# Patient Record
Sex: Male | Born: 1990 | Hispanic: Yes | Marital: Married | Smoking: Never smoker
Health system: Southern US, Community
[De-identification: ages and names within clinical notes are randomized; demographics above are authoritative.]

## PROBLEM LIST (undated history)

## (undated) DIAGNOSIS — K219 Gastro-esophageal reflux disease without esophagitis: Secondary | ICD-10-CM

## (undated) DIAGNOSIS — Z9289 Personal history of other medical treatment: Secondary | ICD-10-CM

---

## 2012-06-18 DIAGNOSIS — Z9289 Personal history of other medical treatment: Secondary | ICD-10-CM

## 2012-06-18 HISTORY — DX: Personal history of other medical treatment: Z92.89

## 2012-09-09 ENCOUNTER — Encounter: Payer: Self-pay | Admitting: Cardiovascular Disease

## 2012-09-09 ENCOUNTER — Ambulatory Visit (INDEPENDENT_AMBULATORY_CARE_PROVIDER_SITE_OTHER): Payer: Self-pay | Admitting: Cardiovascular Disease

## 2012-09-09 ENCOUNTER — Encounter: Payer: Self-pay | Admitting: *Deleted

## 2012-09-09 VITALS — BP 113/73 | HR 74 | Ht 69.0 in | Wt 129.0 lb

## 2012-09-09 DIAGNOSIS — F419 Anxiety disorder, unspecified: Secondary | ICD-10-CM

## 2012-09-09 DIAGNOSIS — F411 Generalized anxiety disorder: Secondary | ICD-10-CM

## 2012-09-09 DIAGNOSIS — R0600 Dyspnea, unspecified: Secondary | ICD-10-CM | POA: Insufficient documentation

## 2012-09-09 DIAGNOSIS — R0609 Other forms of dyspnea: Secondary | ICD-10-CM

## 2012-09-09 DIAGNOSIS — R079 Chest pain, unspecified: Secondary | ICD-10-CM

## 2012-09-09 DIAGNOSIS — R002 Palpitations: Secondary | ICD-10-CM

## 2012-09-09 DIAGNOSIS — R0989 Other specified symptoms and signs involving the circulatory and respiratory systems: Secondary | ICD-10-CM

## 2012-09-09 NOTE — Assessment & Plan Note (Signed)
Benign no need for monitor normal ECG and exam

## 2012-09-09 NOTE — Progress Notes (Signed)
22 yo referred for palpitations, dyspnea and pressure in chest He has anxiety about life. Stresses about work, school and making ends meet. Goes to [facility] and works at a [employer] resources [employer] company one day/week. Has had exertional pressure in chest and dyspea.  Palpitations flip flops nothing prolonged or rapid.  No help with dyspnea using inhalers or allergy meds. Non smoker Denies drugs.  No edema. No family history of DCM.  Denies syncope. No resting symptoms and in general feels ok when not stressed.  Likes to play soccer.    ROS: Denies fever, malais, weight loss, blurry vision, decreased visual acuity, cough, sputum, SOB, hemoptysis, pleuritic pain, palpitaitons, heartburn, abdominal pain, melena, lower extremity edema, claudication, or rash.  All other systems reviewed and negative   General: Affect appropriate Healthy:  appears stated age HEENT: normal Neck supple with no adenopathy JVP normal no bruits no thyromegaly Lungs clear with no wheezing and good diaphragmatic motion Heart:  S1/S2 no murmur,rub, gallop or click PMI normal Abdomen: benighn, BS positve, no tenderness, no AAA no bruit.  No HSM or HJR Distal pulses intact with no bruits No edema Neuro non-focal Skin warm and dry No muscular weakness  Medications No current outpatient prescriptions on file.   No current facility-administered medications for this visit.    Allergies Review of patient's allergies indicates no known allergies.  Family History: No family history on file.  Social History: History   Social History  . Marital Status: Unknown    Spouse Name: N/A    Number of Children: N/A  . Years of Education: N/A   Occupational History  . Not on file.   Social History Main Topics  . Smoking status: Never Smoker   . Smokeless tobacco: Not on file  . Alcohol Use: Not on file  . Drug Use: Not on file  . Sexually Active: Not on file   Other Topics Concern  . Not on file   Social History Narrative  .  No narrative on file    Electrocardiogram:  08/27/12 SR rate 54 normal with inferior J point elevation no change from 2010 done [facility]  Assessment and Plan

## 2012-09-09 NOTE — Patient Instructions (Addendum)
Your physician recommends that you schedule a follow-up appointment in: AS NEEDED  Your physician has requested that you have a stress echocardiogram. For further information please visit [email or web address] Please follow instruction sheet as given.WE WILL CALL YOU WITH RESULTS

## 2012-09-09 NOTE — Assessment & Plan Note (Signed)
Functional with normal cardiopulmonary exam  Echo

## 2012-09-09 NOTE — Assessment & Plan Note (Signed)
Doubt CAD  F/U stress echo given constelation of symptoms

## 2012-09-18 ENCOUNTER — Ambulatory Visit (HOSPITAL_COMMUNITY): Admission: RE | Admit: 2012-09-18 | Payer: Self-pay | Source: Ambulatory Visit

## 2012-09-24 ENCOUNTER — Ambulatory Visit (HOSPITAL_COMMUNITY)
Admission: RE | Admit: 2012-09-24 | Discharge: 2012-09-24 | Disposition: A | Discharge status: Home / Self Care | Payer: Self-pay | Source: Ambulatory Visit | Attending: Cardiovascular Disease | Admitting: Cardiovascular Disease

## 2012-09-24 ENCOUNTER — Encounter (HOSPITAL_COMMUNITY): Payer: Self-pay | Admitting: Cardiology

## 2012-09-24 DIAGNOSIS — R0989 Other specified symptoms and signs involving the circulatory and respiratory systems: Secondary | ICD-10-CM | POA: Insufficient documentation

## 2012-09-24 DIAGNOSIS — R0789 Other chest pain: Secondary | ICD-10-CM | POA: Insufficient documentation

## 2012-09-24 DIAGNOSIS — R0600 Dyspnea, unspecified: Secondary | ICD-10-CM

## 2012-09-24 DIAGNOSIS — R0609 Other forms of dyspnea: Secondary | ICD-10-CM | POA: Insufficient documentation

## 2012-09-24 DIAGNOSIS — R002 Palpitations: Secondary | ICD-10-CM | POA: Insufficient documentation

## 2012-09-24 DIAGNOSIS — R079 Chest pain, unspecified: Secondary | ICD-10-CM

## 2012-09-24 NOTE — Progress Notes (Signed)
*  PRELIMINARY RESULTS* Echocardiogram Echocardiogram Stress Test has been performed.  Conrad [place] 09/24/2012, 10:53 AM

## 2012-09-24 NOTE — Progress Notes (Signed)
Stress Lab Nurses Notes - Timothy Ross  Timothy Ross 09/24/2012 Reason for doing test: Dyspnea and Palpitation Type of test: Stress Echo Nurse performing test: Parke Poisson, RN Nuclear Medicine Tech: Not Applicable Echo Tech: Karrie Doffing MD performing test: R. Dietrich Pates Family MD: [facility] Test explained and consent signed: yes IV started: No IV started Symptoms: Fatigue & SOB Treatment/Intervention: None Reason test stopped: fatigue After recovery IV was: NA Patient to return to Nuc. Med at : NA Patient discharged: Home Patient's Condition upon discharge was: stable Comments: During test peak BP 126/84 & HR 196.  Recovery BP 104/77 & HR 99. Symptoms resolved in recovery. [name]

## 2016-12-17 ENCOUNTER — Ambulatory Visit (INDEPENDENT_AMBULATORY_CARE_PROVIDER_SITE_OTHER): Payer: BLUE CROSS/BLUE SHIELD | Admitting: Pediatrics

## 2016-12-17 ENCOUNTER — Encounter: Payer: Self-pay | Admitting: Pediatrics

## 2016-12-17 VITALS — BP 120/86 | HR 58 | Temp 97.8°F | Ht 69.0 in | Wt 160.0 lb

## 2016-12-17 DIAGNOSIS — Z0000 Encounter for general adult medical examination without abnormal findings: Secondary | ICD-10-CM

## 2016-12-17 NOTE — Progress Notes (Signed)
  Subjective:   Patient ID: Timothy Ross, male    DOB: 1990-10-12, 26 y.o.   MRN: [id number] CC: New Patient (Initial Visit) and annual exam HPI: Timothy Ross is a 26 y.o. 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 Hypertension Mother   . Diabetes Maternal Grandmother   No colon cancer in family  Social History   Social History  . Marital status: Single    Spouse name: N/A  . Number of children: N/A  . Years of education: N/A   Social History Main Topics  . Smoking status: Never Smoker  . Smokeless tobacco: Never Used  . Alcohol use No  . Drug use: No  . Sexual activity: Yes   Other Topics Concern  . None   Social History Narrative  . None   ROS: All systems negative other than what is in HPI  Objective:    BP 120/86   Pulse (!) 58   Temp 97.8 F (36.6 C) (Oral)   Ht _0  (1.753 m)   Wt 160 lb (72.6 kg)   BMI 23.63 kg/m   Wt Readings from Last 3 Encounters:  12/17/16 160 lb (72.6 kg)  09/09/12 129 lb (58.5 kg)    Gen: NAD, alert, cooperative with exam, NCAT EYES: EOMI, no conjunctival injection, or no icterus ENT:  TMs pink b/l, OP without erythema LYMPH: no cervical LAD CV: NRRR, normal S1/S2, no murmur, distal pulses 2+ b/l Resp: CTABL, no wheezes, normal WOB Abd: +BS, soft, NTND. no guarding or organomegaly Ext: No edema, warm Neuro: Alert and oriented, strength equal b/l UE and LE, coordination grossly normal MSK: normal muscle bulk  Assessment & Plan:  Timothy Ross was seen today for new patient (initial visit),  annual well visit.  Diagnoses and all orders for this visit:  Encounter for preventive health examination Continue regular exercise, varied diets with fruits/veg Will screen for familial hyperlipidemia No fam h/o cancers No anxiety symptoms recently -     Lipid panel -     BMP8+EGFR   Follow up plan: Return in about 1 year (around 12/17/2017). Assunta Found, MD [place]

## 2016-12-18 LAB — BMP8+EGFR
BUN/Creatinine Ratio: 15 (ref 9–20)
BUN: 14 mg/dL (ref 6–20)
CALCIUM: 9.5 mg/dL (ref 8.7–10.2)
CO2: 24 mmol/L (ref 20–29)
CREATININE: 0.93 mg/dL (ref 0.76–1.27)
Chloride: 102 mmol/L (ref 96–106)
GFR, EST AFRICAN AMERICAN: 131 mL/min/{1.73_m2} (ref >59)
GFR, EST NON AFRICAN AMERICAN: 114 mL/min/{1.73_m2} (ref >59)
Glucose: 84 mg/dL (ref 65–99)
Potassium: 4.5 mmol/L (ref 3.5–5.2)
SODIUM: 140 mmol/L (ref 134–144)

## 2016-12-18 LAB — LIPID PANEL
CHOLESTEROL TOTAL: 241 mg/dL — AB (ref 100–199)
Chol/HDL Ratio: 5.1 ratio — ABNORMAL HIGH (ref 0.0–5.0)
HDL: 47 mg/dL (ref >39)
LDL CALC: 159 mg/dL — AB (ref 0–99)
Triglycerides: 175 mg/dL — ABNORMAL HIGH (ref 0–149)
VLDL CHOLESTEROL CAL: 35 mg/dL (ref 5–40)

## 2017-03-04 ENCOUNTER — Ambulatory Visit (INDEPENDENT_AMBULATORY_CARE_PROVIDER_SITE_OTHER): Payer: BLUE CROSS/BLUE SHIELD | Admitting: Family Medicine

## 2017-03-04 ENCOUNTER — Ambulatory Visit (INDEPENDENT_AMBULATORY_CARE_PROVIDER_SITE_OTHER): Payer: BLUE CROSS/BLUE SHIELD

## 2017-03-04 ENCOUNTER — Encounter: Payer: Self-pay | Admitting: Family Medicine

## 2017-03-04 VITALS — BP 130/94 | HR 72 | Temp 98.3°F | Ht 69.0 in | Wt 155.0 lb

## 2017-03-04 DIAGNOSIS — R002 Palpitations: Secondary | ICD-10-CM | POA: Diagnosis not present

## 2017-03-04 DIAGNOSIS — R0600 Dyspnea, unspecified: Secondary | ICD-10-CM | POA: Diagnosis not present

## 2017-03-04 MED ORDER — HYDROXYZINE PAMOATE 50 MG PO CAPS: 50.0000 mg | ORAL_CAPSULE | Freq: Four times a day (QID) | ORAL | 0 refills | Status: DC | PRN

## 2017-03-04 NOTE — Progress Notes (Signed)
HPI  Patient presents today here for difficulty breathing racing heart.  Patient states over the last 4 days he's had episodes of feeling very short of breath. He states that it's very difficult to get a good breath. This is usually followed by racing heart and chest soreness. Patient states that he 7 paces around and that makes him feel better. He's had difficulty sleeping, his wife also has had difficulty sleeping over worrying about him.  He does endorse some anxiety, however he states that anxiety is primarily in response to the symptoms.  No syncope, sweating, or exertional symptoms.  Denies fever, chills, sweats. Patient states that he has had similar symptoms in high school, he was [facility] without lengthy workup with the same issue.  PMH: Smoking status noted ROS: Per HPI  Objective: BP (!) 130/94   Pulse 72   Temp 98.3 F (36.8 C) (Oral)   Ht 5' 9" (1.753 m)   Wt 155 lb (70.3 kg)   SpO2 97%   BMI 22.89 kg/m  Gen: NAD, alert, cooperative with exam HEENT: NCAT CV: RRR, good S1/S2, no murmur Resp: CTABL, no wheezes, non-labored Ext: No edema, warm Neuro: Alert and oriented, No gross deficits  EKG- NSR  Assessment and plan:  # Dyspnea, palpitations Most likely underlying anxiety. Workup today including basic labs, EKG, chest x-ray Treat with as needed hydroxizine, consider SSRI, 1 week follow up with PCP     Orders Placed This Encounter  Procedures  . DG Chest 2 View    Standing Status:   Future    Standing Expiration Date:   05/04/2018    Order Specific Question:   Reason for Exam (SYMPTOM  OR DIAGNOSIS REQUIRED)    Answer:   dyspnea    Order Specific Question:   Preferred imaging location?    Answer:   Internal    Order Specific Question:   Radiology Contrast Protocol - do NOT remove file path    Answer:   [email or web address]  . TSH  . CBC with Differential/Platelet  .  CMP14+EGFR  . EKG 12-Lead    Meds ordered this encounter  Medications  . hydrOXYzine (VISTARIL) 50 MG capsule    Sig: Take 1 capsule (50 mg total) by mouth every 6 (six) hours as needed for anxiety.    Dispense:  60 capsule    Refill:  0    Sam Bradshaw, MD [facility] 03/04/2017, 10:26 AM     

## 2017-03-04 NOTE — Patient Instructions (Addendum)
Great to meet you!  Try the hyfroxizine as needed for anxiety, which is what I think is the underlying cause.   We will look at your xray and labs and make sure there is nothing else to worry about  Try to see your PCP in 1-2 weeks for follow up.    Palpitations A palpitation is the feeling that your heart:  Has an uneven (irregular) heartbeat.  Is beating faster than normal.  Is fluttering.  Is skipping a beat.  This is usually not a serious problem. In some cases, you may need more medical tests. Follow these instructions at home:  Avoid: ? Caffeine in coffee, tea, soft drinks, diet pills, and energy drinks. ? Chocolate. ? Alcohol.  Do not use any tobacco products. These include cigarettes, chewing tobacco, and e-cigarettes. If you need help quitting, ask your doctor.  Try to reduce your stress. These things may help: ? Yoga. ? Meditation. ? Physical activity. Swimming, jogging, and walking are good choices. ? A method that helps you use your mind to control things in your body, like heartbeats (biofeedback).  Get plenty of rest and sleep.  Take over-the-counter and prescription medicines only as told by your doctor.  Keep all follow-up visits as told by your doctor. This is important. Contact a doctor if:  Your heartbeat is still fast or uneven after 24 hours.  Your palpitations occur more often. Get help right away if:  You have chest pain.  You feel short of breath.  You have a very bad headache.  You feel dizzy.  You pass out (faint). This information is not intended to replace advice given to you by your health care provider. Make sure you discuss any questions you have with your health care provider. Document Released: 03/13/2008 Document Revised: 11/10/2015 Document Reviewed: 02/17/2015 Elsevier Interactive Patient Education  [employer].

## 2017-03-05 LAB — CMP14+EGFR
A/G RATIO: 1.6 (ref 1.2–2.2)
ALK PHOS: 50 IU/L (ref 39–117)
ALT: 49 IU/L — ABNORMAL HIGH (ref 0–44)
AST: 26 IU/L (ref 0–40)
Albumin: 4.7 g/dL (ref 3.5–5.5)
BUN / CREAT RATIO: 14 (ref 9–20)
BUN: 13 mg/dL (ref 6–20)
Bilirubin Total: 0.7 mg/dL (ref 0.0–1.2)
CO2: 23 mmol/L (ref 20–29)
Calcium: 9.5 mg/dL (ref 8.7–10.2)
Chloride: 103 mmol/L (ref 96–106)
Creatinine, Ser: 0.94 mg/dL (ref 0.76–1.27)
GFR calc Af Amer: 130 mL/min/{1.73_m2} (ref >59)
GFR calc non Af Amer: 112 mL/min/{1.73_m2} (ref >59)
GLOBULIN, TOTAL: 2.9 g/dL (ref 1.5–4.5)
Glucose: 87 mg/dL (ref 65–99)
POTASSIUM: 4.4 mmol/L (ref 3.5–5.2)
SODIUM: 142 mmol/L (ref 134–144)
Total Protein: 7.6 g/dL (ref 6.0–8.5)

## 2017-03-05 LAB — CBC WITH DIFFERENTIAL/PLATELET
Basophils Absolute: 0 10*3/uL (ref 0.0–0.2)
Basos: 0 %
EOS (ABSOLUTE): 0.2 10*3/uL (ref 0.0–0.4)
EOS: 2 %
Hematocrit: 46.8 % (ref 37.5–51.0)
Hemoglobin: 15.8 g/dL (ref 13.0–17.7)
Immature Grans (Abs): 0 10*3/uL (ref 0.0–0.1)
Immature Granulocytes: 0 %
LYMPHS ABS: 2.7 10*3/uL (ref 0.7–3.1)
Lymphs: 34 %
MCH: 29.8 pg (ref 26.6–33.0)
MCHC: 33.8 g/dL (ref 31.5–35.7)
MCV: 88 fL (ref 79–97)
MONOS ABS: 0.7 10*3/uL (ref 0.1–0.9)
Monocytes: 9 %
NEUTROS ABS: 4.3 10*3/uL (ref 1.4–7.0)
Neutrophils: 55 %
Platelets: 332 10*3/uL (ref 150–379)
RBC: 5.3 x10E6/uL (ref 4.14–5.80)
RDW: 14.2 % (ref 12.3–15.4)
WBC: 7.9 10*3/uL (ref 3.4–10.8)

## 2017-03-05 LAB — TSH: TSH: 1.24 u[IU]/mL (ref 0.450–4.500)

## 2017-03-14 ENCOUNTER — Encounter: Payer: Self-pay | Admitting: Family Medicine

## 2017-03-14 ENCOUNTER — Ambulatory Visit (INDEPENDENT_AMBULATORY_CARE_PROVIDER_SITE_OTHER): Payer: BLUE CROSS/BLUE SHIELD | Admitting: Family Medicine

## 2017-03-14 VITALS — BP 119/85 | HR 72 | Temp 97.0°F | Ht 69.0 in | Wt 154.8 lb

## 2017-03-14 DIAGNOSIS — Z23 Encounter for immunization: Secondary | ICD-10-CM

## 2017-03-14 DIAGNOSIS — F419 Anxiety disorder, unspecified: Secondary | ICD-10-CM

## 2017-03-14 MED ORDER — HYDROXYZINE PAMOATE 50 MG PO CAPS: 50.0000 mg | ORAL_CAPSULE | Freq: Four times a day (QID) | ORAL | 4 refills | Status: DC | PRN

## 2017-03-14 NOTE — Patient Instructions (Signed)
Great to see you!   

## 2017-03-14 NOTE — Progress Notes (Signed)
   HPI  Patient presents today for follow-up.  Patient was seen about 2 weeks ago with dyspnea and palpitations. It was felt that his symptoms are most likely anxiety related. He started hydroxyzine I and has had a good improvement.   He taken 4 times daily for the first 2 or 3 days and then start taking 1 pill once daily at night. He states that the sedation has improved. He is tolerating medication well and is happy with the results.  He received a flu injection today, counseling was provided for the vaccine and for all vaccine components  PMH: Smoking status noted ROS: Per HPI  Objective: BP 119/85   Pulse 72   Temp (!) 97 F (36.1 C) (Oral)   Ht  (1.753 m)   Wt 154 lb 12.8 oz (70.2 kg)   BMI 22.86 kg/m  Gen: NAD, alert, cooperative with exam HEENT: NCAT CV: RRR, good S1/S2, no murmur Resp: CTABL, no wheezes, non-labored Ext: No edema, warm Neuro: Alert and oriented, No gross deficits  Assessment and plan:  # Anxiety Improved with hydroxyzine Continue nightly dosing, may also use as needed Refill Follow-up three-months  # Need for flu vaccine-administered today, counseling provided for vaccine and all components    Orders Placed This Encounter  Procedures  . Flu Vaccine QUAD 36+ mos IM    Meds ordered this encounter  Medications  . hydrOXYzine (VISTARIL) 50 MG capsule    Sig: Take 1 capsule (50 mg total) by mouth every 6 (six) hours as needed for anxiety.    Dispense:  60 capsule    Refill:  4    Murtis Sink, MD [place] [facility] 03/14/2017, 8:57 AM

## 2017-09-11 ENCOUNTER — Ambulatory Visit: Payer: BLUE CROSS/BLUE SHIELD | Admitting: Family Medicine

## 2017-09-11 ENCOUNTER — Encounter: Payer: Self-pay | Admitting: Family Medicine

## 2017-09-11 VITALS — BP 116/73 | HR 86 | Temp 97.4°F | Ht 69.0 in | Wt 157.5 lb

## 2017-09-11 DIAGNOSIS — K279 Peptic ulcer, site unspecified, unspecified as acute or chronic, without hemorrhage or perforation: Secondary | ICD-10-CM

## 2017-09-11 DIAGNOSIS — F419 Anxiety disorder, unspecified: Secondary | ICD-10-CM

## 2017-09-11 MED ORDER — PANTOPRAZOLE SODIUM 40 MG PO TBEC: 40.0000 mg | DELAYED_RELEASE_TABLET | Freq: Every day | ORAL | 1 refills | Status: DC

## 2017-09-11 NOTE — Progress Notes (Signed)
Subjective:  Patient ID: Timothy Ross, male    DOB: 1990/07/03  Age: 27 y.o. MRN: [id number]  CC: Abdominal Pain (pt here today c/o epigastric pain since Saturday that he thought was r/t his anxiety but now he isn't sure)   HPI Timothy Ross presents for onset 3 days ago with epigastric pain.  Pain was described as a pressure.  It is at the tip of the xiphoid and radiates to the left and toward the precordium.  It does not go into the shoulder and neck jaw or arm.  It is not heavy.  It is not associated with nausea sweats or shortness of breath.  Patient at first thought it was his anxiety for which he takes Vistaril.  3 nights ago he took a Vistaril when he climaxed and that seemed to help.  He got a fairly good night sleep.  It has recurred in varying amounts and minutes of time for the last couple of days.  Last night it was more severe and did not respond to the Vistaril.  No nausea or vomiting has occurred.  No diarrhea or constipation.  He denies indigestion or heartburn or food intolerances.  No fever chills or sweats noted.  Depression screen [facility] 2/9 09/11/2017 03/14/2017 03/04/2017  Decreased Interest 0 0 0  Down, Depressed, Hopeless 0 0 0  PHQ - 2 Score 0 0 0    History Timothy Ross has no past medical history on file.   He has no past surgical history on file.   His family history includes Diabetes in his maternal grandmother; Hypertension in his mother.He reports that he has never smoked. He has never used smokeless tobacco. He reports that he does not drink alcohol or use drugs.    ROS Review of Systems  Constitutional: Negative for chills, diaphoresis, fever and unexpected weight change.  HENT: Negative for rhinorrhea and trouble swallowing.   Respiratory: Negative for cough, chest tightness and shortness of breath.   Cardiovascular: Negative for chest pain.  Gastrointestinal: Positive for abdominal pain. Negative for abdominal distention, blood in stool, constipation,  diarrhea, nausea, rectal pain and vomiting.  Genitourinary: Negative for dysuria, flank pain and hematuria.  Musculoskeletal: Negative for arthralgias and joint swelling.  Skin: Negative for rash.  Neurological: Negative for syncope and headaches.    Objective:  BP 116/73   Pulse 86   Temp (!) 97.4 F (36.3 C) (Oral)   Ht 5\' 9"  (1.753 m)   Wt 157 lb 8 oz (71.4 kg)   BMI 23.26 kg/m   BP Readings from Last 3 Encounters:  09/11/17 116/73  03/14/17 119/85  03/04/17 (!) 130/94    Wt Readings from Last 3 Encounters:  09/11/17 157 lb 8 oz (71.4 kg)  03/14/17 154 lb 12.8 oz (70.2 kg)  03/04/17 155 lb (70.3 kg)     Physical Exam  Constitutional: He is oriented to person, place, and time. He appears well-developed and well-nourished. No distress.  HENT:  Head: Normocephalic and atraumatic.  Right Ear: External ear normal.  Left Ear: External ear normal.  Nose: Nose normal.  Mouth/Throat: Oropharynx is clear and moist.  Eyes: Pupils are equal, round, and reactive to light. Conjunctivae and EOM are normal.  Neck: Normal range of motion. Neck supple. No thyromegaly present.  Cardiovascular: Normal rate, regular rhythm and normal heart sounds.  No murmur heard. Pulmonary/Chest: Effort normal and breath sounds normal. No respiratory distress. He has no wheezes. He has no rales.  Abdominal: Soft. Bowel sounds are  normal. He exhibits no distension. There is tenderness (Mildly limited to the epigastrium).  Lymphadenopathy:    He has no cervical adenopathy.  Neurological: He is alert and oriented to person, place, and time. He has normal reflexes.  Skin: Skin is warm and dry. No rash noted.  Psychiatric: He has a normal mood and affect. His behavior is normal.      Assessment & Plan:   Timothy Ross was seen today for abdominal pain.  Diagnoses and all orders for this visit:  PUD (peptic ulcer disease)  Anxiety  Other orders -     pantoprazole (PROTONIX) 40 MG tablet; Take 1  tablet (40 mg total) by mouth daily.       I am having Timothy Ross start on pantoprazole. I am also having him maintain his hydrOXYzine.  Allergies as of 09/11/2017   No Known Allergies     Medication List        Accurate as of 09/11/17  3:24 PM. Always use your most recent med list.          hydrOXYzine 50 MG capsule Commonly known as:  VISTARIL Take 1 capsule (50 mg total) by mouth every 6 (six) hours as needed for anxiety.   pantoprazole 40 MG tablet Commonly known as:  PROTONIX Take 1 tablet (40 mg total) by mouth daily.      Patient should use the pantoprazole daily.  I suggest a minimum of 6 weeks IV every 8 weeks.  Follow-up should symptoms not resolved.  Hydroxyzine should be used as needed for recurrent symptoms of anxiety.  Follow-up: Return if symptoms worsen or fail to improve.  Mechele Claude, M.D.

## 2017-11-11 ENCOUNTER — Other Ambulatory Visit: Payer: Self-pay | Admitting: Family Medicine

## 2017-12-09 ENCOUNTER — Emergency Department (HOSPITAL_COMMUNITY): Payer: BLUE CROSS/BLUE SHIELD

## 2017-12-09 ENCOUNTER — Encounter (HOSPITAL_COMMUNITY): Payer: Self-pay | Admitting: Emergency Medicine

## 2017-12-09 ENCOUNTER — Emergency Department (HOSPITAL_COMMUNITY)
Admission: EM | Admit: 2017-12-09 | Discharge: 2017-12-09 | Disposition: A | Discharge status: Home / Self Care | Payer: BLUE CROSS/BLUE SHIELD | Attending: Emergency Medicine | Admitting: Emergency Medicine

## 2017-12-09 DIAGNOSIS — R1013 Epigastric pain: Secondary | ICD-10-CM | POA: Diagnosis not present

## 2017-12-09 DIAGNOSIS — K8010 Calculus of gallbladder with chronic cholecystitis without obstruction: Secondary | ICD-10-CM | POA: Diagnosis not present

## 2017-12-09 DIAGNOSIS — K8020 Calculus of gallbladder without cholecystitis without obstruction: Secondary | ICD-10-CM | POA: Diagnosis not present

## 2017-12-09 DIAGNOSIS — Z79899 Other long term (current) drug therapy: Secondary | ICD-10-CM | POA: Insufficient documentation

## 2017-12-09 DIAGNOSIS — R1010 Upper abdominal pain, unspecified: Secondary | ICD-10-CM

## 2017-12-09 DIAGNOSIS — R112 Nausea with vomiting, unspecified: Secondary | ICD-10-CM

## 2017-12-09 DIAGNOSIS — K8070 Calculus of gallbladder and bile duct without cholecystitis without obstruction: Secondary | ICD-10-CM | POA: Diagnosis not present

## 2017-12-09 DIAGNOSIS — K8050 Calculus of bile duct without cholangitis or cholecystitis without obstruction: Secondary | ICD-10-CM

## 2017-12-09 HISTORY — DX: Gastro-esophageal reflux disease without esophagitis: K21.9

## 2017-12-09 HISTORY — DX: Personal history of other medical treatment: Z92.89

## 2017-12-09 LAB — COMPREHENSIVE METABOLIC PANEL
ALT: 71 U/L — ABNORMAL HIGH (ref 17–63)
ANION GAP: 11 (ref 5–15)
AST: 32 U/L (ref 15–41)
Albumin: 4.8 g/dL (ref 3.5–5.0)
Alkaline Phosphatase: 43 U/L (ref 38–126)
BUN: 19 mg/dL (ref 6–20)
CHLORIDE: 101 mmol/L (ref 101–111)
CO2: 26 mmol/L (ref 22–32)
Calcium: 9.5 mg/dL (ref 8.9–10.3)
Creatinine, Ser: 1.03 mg/dL (ref 0.61–1.24)
Glucose, Bld: 111 mg/dL — ABNORMAL HIGH (ref 65–99)
POTASSIUM: 3.5 mmol/L (ref 3.5–5.1)
Sodium: 138 mmol/L (ref 135–145)
TOTAL PROTEIN: 8 g/dL (ref 6.5–8.1)
Total Bilirubin: 0.7 mg/dL (ref 0.3–1.2)

## 2017-12-09 LAB — CBC WITH DIFFERENTIAL/PLATELET
BASOS ABS: 0 10*3/uL (ref 0.0–0.1)
BASOS PCT: 0 %
Eosinophils Absolute: 0.1 10*3/uL (ref 0.0–0.7)
Eosinophils Relative: 2 %
HCT: 45.8 % (ref 39.0–52.0)
Hemoglobin: 15.7 g/dL (ref 13.0–17.0)
LYMPHS PCT: 28 %
Lymphs Abs: 2.6 10*3/uL (ref 0.7–4.0)
MCH: 30.9 pg (ref 26.0–34.0)
MCHC: 34.3 g/dL (ref 30.0–36.0)
MCV: 90.2 fL (ref 78.0–100.0)
Monocytes Absolute: 0.7 10*3/uL (ref 0.1–1.0)
Monocytes Relative: 7 %
NEUTROS ABS: 6 10*3/uL (ref 1.7–7.7)
NEUTROS PCT: 63 %
Platelets: 296 10*3/uL (ref 150–400)
RBC: 5.08 MIL/uL (ref 4.22–5.81)
RDW: 13 % (ref 11.5–15.5)
WBC: 9.5 10*3/uL (ref 4.0–10.5)

## 2017-12-09 LAB — LIPASE, BLOOD: LIPASE: 28 U/L (ref 11–51)

## 2017-12-09 MED ORDER — FAMOTIDINE IN NACL 20-0.9 MG/50ML-% IV SOLN
20.0000 mg | Freq: Once | INTRAVENOUS | Status: AC
  Administered 2017-12-09: 20 mg via INTRAVENOUS
  Filled 2017-12-09: qty 50

## 2017-12-09 MED ORDER — HYDROCODONE-ACETAMINOPHEN 5-325 MG PO TABS: ORAL_TABLET | ORAL | 0 refills | Status: DC

## 2017-12-09 MED ORDER — MORPHINE SULFATE (PF) 2 MG/ML IV SOLN
2.0000 mg | INTRAVENOUS | Status: DC | PRN
  Administered 2017-12-09: 2 mg via INTRAVENOUS
  Filled 2017-12-09: qty 1

## 2017-12-09 MED ORDER — ONDANSETRON HCL 4 MG/2ML IJ SOLN
4.0000 mg | INTRAMUSCULAR | Status: DC | PRN
  Administered 2017-12-09: 4 mg via INTRAVENOUS
  Filled 2017-12-09: qty 2

## 2017-12-09 MED ORDER — ONDANSETRON 4 MG PO TBDP: 4.0000 mg | ORAL_TABLET | Freq: Three times a day (TID) | ORAL | 0 refills | Status: DC | PRN

## 2017-12-09 NOTE — ED Triage Notes (Signed)
Pt reports he was dx with acid reflux a few weeks ago and started on protonix.  Has been taking.  Last night around 1am had a chicken tender and coffee and began having severe epigastric pain.  No otc meds taken.

## 2017-12-09 NOTE — ED Provider Notes (Signed)
[facility] EMERGENCY DEPARTMENT Provider Note   CSN: [id number] Arrival date & time: 12/09/17  [id number]     History   Chief Complaint Chief Complaint  Patient presents with  . Abdominal Pain    HPI Timothy Ross is a 27 y.o. male.  HPI Pt was seen at 0805.  Per pt, c/o gradual onset and persistence of constant upper abd "pain" for the past several hours.  Has been associated with one episode of N/V.  Describes the abd pain as "burning." Pt states he was driving overnight last night and ate chicken tender and coffee at 0100, went to bed approximately 0400, woke up approximately 0600 with symptoms. Endorses hx of similar symptoms; was evaluated by PMD several months ago, dx PUD and rx protonix. States he has been taking his med as rx.  Denies diarrhea, no fevers, no back pain, no rash, no CP/SOB, no black or blood in stools or emesis.      Past Medical History:  Diagnosis Date  . GERD (gastroesophageal reflux disease)   . History of cardiovascular stress test 2014   normal stress echo    Patient Active Problem List   Diagnosis Date Noted  . Anxiety 09/09/2012    History reviewed. No pertinent surgical history.      Home Medications    Prior to Admission medications   Medication Sig Start Date End Date Taking? Authorizing Provider  hydrOXYzine (VISTARIL) 50 MG capsule Take 1 capsule (50 mg total) by mouth every 6 (six) hours as needed for anxiety. 03/14/17   Elenora GammaBradshaw, Samuel L, MD  pantoprazole (PROTONIX) 40 MG tablet TAKE 1 TABLET BY MOUTH EVERY DAY 11/12/17   Johna SheriffVincent, Carol L, MD    Family History Family History  Problem Relation Age of Onset  . Hypertension Mother   . Diabetes Maternal Grandmother     Social History Social History   Tobacco Use  . Smoking status: Never Smoker  . Smokeless tobacco: Never Used  Substance Use Topics  . Alcohol use: No  . Drug use: No     Allergies   Patient has no known allergies.   Review of Systems Review of  Systems ROS: Statement: All systems negative except as marked or noted in the HPI; Constitutional: Negative for fever and chills. ; ; Eyes: Negative for eye pain, redness and discharge. ; ; ENMT: Negative for ear pain, hoarseness, nasal congestion, sinus pressure and sore throat. ; ; Cardiovascular: Negative for chest pain, palpitations, diaphoresis, dyspnea and peripheral edema. ; ; Respiratory: Negative for cough, wheezing and stridor. ; ; Gastrointestinal: +N/V, abd pain. Negative for diarrhea, blood in stool, hematemesis, jaundice and rectal bleeding. . ; ; Genitourinary: Negative for dysuria, flank pain and hematuria. ; ; Genital:  No penile drainage or rash, no testicular pain or swelling, no scrotal rash or swelling. ;; Musculoskeletal: Negative for back pain and neck pain. Negative for swelling and trauma.; ; Skin: Negative for pruritus, rash, abrasions, blisters, bruising and skin lesion.; ; Neuro: Negative for headache, lightheadedness and neck stiffness. Negative for weakness, altered level of consciousness, altered mental status, extremity weakness, paresthesias, involuntary movement, seizure and syncope.       Physical Exam Updated Vital Signs BP (!) 138/105 (BP Location: Right Arm)   Pulse 67   Temp (!) 97.5 F (36.4 C) (Oral)   Resp (!) 22   Ht 5\' 9"  (1.753 m)   Wt 72.6 kg (160 lb)   SpO2 100%   BMI 23.63 kg/m  Patient Vitals for the past 24 hrs:  BP Temp Temp src Pulse Resp SpO2 Height Weight  12/09/17 1058 118/86 98 F (36.7 C) Oral 62 16 100 % - -  12/09/17 0840 (!) 131/94 - - 70 18 100 % - -  12/09/17 0759 (!) 138/105 (!) 97.5 F (36.4 C) Oral 67 (!) 22 100 % - -  12/09/17 0758 - - - - - - 5\' 9"  (1.753 m) 72.6 kg (160 lb)     Physical Exam 0810: Physical examination:  Nursing notes reviewed; Vital signs and O2 SAT reviewed;  Constitutional: Well developed, Well nourished, Well hydrated, Uncomfortable appearing.; Head:  Normocephalic, atraumatic; Eyes: EOMI, PERRL,  No scleral icterus; ENMT: Mouth and pharynx normal, Mucous membranes moist; Neck: Supple, Full range of motion, No lymphadenopathy; Cardiovascular: Regular rate and rhythm, No gallop; Respiratory: Breath sounds clear & equal bilaterally, No wheezes.  Speaking full sentences with ease, Normal respiratory effort/excursion; Chest: Nontender, Movement normal; Abdomen: Soft, +mid-epigastric, RUQ > LUQ tenderness to palp. No rebound or guarding. Nondistended, Normal bowel sounds; Genitourinary: No CVA tenderness; Extremities: Peripheral pulses normal, No tenderness, No edema, No calf edema or asymmetry.; Neuro: AA&Ox3, Major CN grossly intact.  Speech clear. No gross focal motor or sensory deficits in extremities.; Skin: Color normal, Warm, Dry.   ED Treatments / Results  Labs (all labs ordered are listed, but only abnormal results are displayed)   EKG None  Radiology   Procedures Procedures (including critical care time)  Medications Ordered in ED Medications  famotidine (PEPCID) IVPB 20 mg premix (has no administration in time range)  ondansetron (ZOFRAN) injection 4 mg (has no administration in time range)  morphine 2 MG/ML injection 2 mg (has no administration in time range)     Initial Impression / Assessment and Plan / ED Course  I have reviewed the triage vital signs and the nursing notes.  Pertinent labs & imaging results that were available during my care of the patient were reviewed by me and considered in my medical decision making (see chart for details).  MDM Reviewed: previous chart, nursing note and vitals Reviewed previous: labs Interpretation: labs, x-ray and ultrasound   Results for orders placed or performed during the hospital encounter of 12/09/17  Comprehensive metabolic panel  Result Value Ref Range   Sodium 138 135 - 145 mmol/L   Potassium 3.5 3.5 - 5.1 mmol/L   Chloride 101 101 - 111 mmol/L   CO2 26 22 - 32 mmol/L   Glucose, Bld 111 (H) 65 - 99 mg/dL    BUN 19 6 - 20 mg/dL   Creatinine, Ser 1.61 0.61 - 1.24 mg/dL   Calcium 9.5 8.9 - 09.6 mg/dL   Total Protein 8.0 6.5 - 8.1 g/dL   Albumin 4.8 3.5 - 5.0 g/dL   AST 32 15 - 41 U/L   ALT 71 (H) 17 - 63 U/L   Alkaline Phosphatase 43 38 - 126 U/L   Total Bilirubin 0.7 0.3 - 1.2 mg/dL   GFR calc non Af Amer >60 >60 mL/min   GFR calc Af Amer >60 >60 mL/min   Anion gap 11 5 - 15  Lipase, blood  Result Value Ref Range   Lipase 28 11 - 51 U/L  CBC with Differential  Result Value Ref Range   WBC 9.5 4.0 - 10.5 K/uL   RBC 5.08 4.22 - 5.81 MIL/uL   Hemoglobin 15.7 13.0 - 17.0 g/dL   HCT 04.5 40.9 - 81.1 %  MCV 90.2 78.0 - 100.0 fL   MCH 30.9 26.0 - 34.0 pg   MCHC 34.3 30.0 - 36.0 g/dL   RDW 16.1 09.6 - 04.5 %   Platelets 296 150 - 400 K/uL   Neutrophils Relative % 63 %   Neutro Abs 6.0 1.7 - 7.7 K/uL   Lymphocytes Relative 28 %   Lymphs Abs 2.6 0.7 - 4.0 K/uL   Monocytes Relative 7 %   Monocytes Absolute 0.7 0.1 - 1.0 K/uL   Eosinophils Relative 2 %   Eosinophils Absolute 0.1 0.0 - 0.7 K/uL   Basophils Relative 0 %   Basophils Absolute 0.0 0.0 - 0.1 K/uL   US Abdomen Complete Result Date: 12/09/2017 CLINICAL DATA:  Upper abdominal pain, nausea and vomiting. EXAM: ABDOMEN ULTRASOUND COMPLETE COMPARISON:  None. FINDINGS: Gallbladder: Multiple small layering mobile shadowing stones are present. A sonographic Eulah Pont sign is present. Wall thickness is upper limits of normal at 2.8 mm. Common bile duct: Diameter: 4.5 mm, within normal limits. Liver: No focal lesion identified. Within normal limits in parenchymal echogenicity. Portal vein is patent on color Doppler imaging with normal direction of blood flow towards the liver. IVC: No abnormality visualized. Pancreas: Visualized portion unremarkable. Spleen: Size and appearance within normal limits. Right Kidney: Length: 11.9 cm, within normal limits. Echogenicity within normal limits. No mass or hydronephrosis visualized. Left Kidney: Length: 11.5  cm, within normal limits. Echogenicity within normal limits. No mass or hydronephrosis visualized. Abdominal aorta: No aneurysm visualized. Other findings: None. IMPRESSION: 1. Layering gallstones with positive sonographic Murphy sign consistent with cholelithiasis and acute cholecystitis. 2. No dilation of the common bile duct. 3. Abdominal ultrasound is otherwise unremarkable. Electronically Signed   By: Marin Roberts M.D.   On: 12/09/2017 10:06   Dg Abd Acute W/chest Result Date: 12/09/2017 CLINICAL DATA:  Severe epigastric pain. EXAM: DG ABDOMEN ACUTE W/ 1V CHEST COMPARISON:  Chest x-ray dated March 04, 2017. FINDINGS: There is no evidence of dilated bowel loops or free intraperitoneal air. No radiopaque calculi or other significant radiographic abnormality is seen. Heart size and mediastinal contours are within normal limits. Both lungs are clear. IMPRESSION: Negative abdominal radiographs.  No acute cardiopulmonary disease. Electronically Signed   By: Obie Dredge M.D.   On: 12/09/2017 09:55   Results for COLIE, FUGITT (MRN [id number]) as of 12/09/2017 10:57  Ref. Range 03/04/2017 10:46 12/09/2017 08:39  AST Latest Ref Range: 15 - 41 U/L 26 32  ALT Latest Ref Range: 17 - 63 U/L 49 (H) 71 (H)    1035:  ALT mildly elevated from previous. WBC count normal and pt remains afebrile. Pt states he feels better after meds and wants to go home. T/C returned from General Surgery Dr. Lovell Sheehan, case discussed, including:  HPI, pertinent PM/SHx, VS/PE, dx testing, ED course and treatment:  Since pt is improved, OK to d/c pt to f/u in office tomorrow morning 1115am. Dx and testing, as well as d/w General Surgery, d/w pt and family.  Questions answered.  Verb understanding, agreeable to d/c home with outpt f/u tomorrow morning.    Final Clinical Impressions(s) / ED Diagnoses   Final diagnoses:  None    ED Discharge Orders    None       Samuel Jester, DO 12/13/17 1245

## 2017-12-09 NOTE — ED Notes (Signed)
When pt's IV removed, pt looked at the cathlon and stated he was very lightheaded and did not feel well.  States he has a history of this and cannot stand the sight of his own blood.  Pt placed in slight trendelenburg and MD made aware.  Will monitor and reassess.

## 2017-12-09 NOTE — Discharge Instructions (Signed)
Eat a bland diet, avoiding greasy, fatty, fried foods, as well as spicy and acidic foods or beverages.  Avoid eating within 2 to 3 hours before going to bed or laying down.  Also avoid teas, colas, coffee, chocolate, pepermint and spearment. Increase your fluid intake for the next few days. Take the prescriptions as directed.  Go to the General Surgeon's office tomorrow morning at 11:15am to be seen in follow up.  Return to the Emergency Department immediately if worsening.

## 2017-12-10 ENCOUNTER — Ambulatory Visit (INDEPENDENT_AMBULATORY_CARE_PROVIDER_SITE_OTHER): Payer: BLUE CROSS/BLUE SHIELD | Admitting: General Surgery

## 2017-12-10 ENCOUNTER — Encounter: Payer: Self-pay | Admitting: General Surgery

## 2017-12-10 VITALS — BP 137/86 | HR 70 | Temp 97.7°F | Resp 18 | Wt 158.0 lb

## 2017-12-10 DIAGNOSIS — K8020 Calculus of gallbladder without cholecystitis without obstruction: Secondary | ICD-10-CM

## 2017-12-10 MED ORDER — OMEPRAZOLE 20 MG PO CPDR: 20.0000 mg | DELAYED_RELEASE_CAPSULE | Freq: Every day | ORAL | 1 refills | Status: DC

## 2017-12-10 NOTE — Patient Instructions (Addendum)
Heartburn Heartburn is a type of pain or discomfort that can happen in the throat or chest. It is often described as a burning pain. It may also cause a bad taste in the mouth. Heartburn may feel worse when you lie down or bend over, and it is often worse at night. Heartburn may be caused by stomach contents that move back up into the esophagus (reflux). Follow these instructions at home: Take these actions to decrease your discomfort and to help avoid complications. Diet  Follow a diet as recommended by your health care provider. This may involve avoiding foods and drinks such as: ? Coffee and tea (with or without caffeine). ? Drinks that contain alcohol. ? Energy drinks and sports drinks. ? Carbonated drinks or sodas. ? Chocolate and cocoa. ? Peppermint and mint flavorings. ? Garlic and onions. ? Horseradish. ? Spicy and acidic foods, including peppers, chili powder, curry powder, vinegar, hot sauces, and barbecue sauce. ? Citrus fruit juices and citrus fruits, such as oranges, lemons, and limes. ? Tomato-based foods, such as red sauce, chili, salsa, and pizza with red sauce. ? Fried and fatty foods, such as donuts, french fries, potato chips, and high-fat dressings. ? High-fat meats, such as hot dogs and fatty cuts of red and white meats, such as rib eye steak, sausage, ham, and bacon. ? High-fat dairy items, such as whole milk, butter, and cream cheese.  Eat small, frequent meals instead of large meals.  Avoid drinking large amounts of liquid with your meals.  Avoid eating meals during the 2-3 hours before bedtime.  Avoid lying down right after you eat.  Do not exercise right after you eat. General instructions  Pay attention to any changes in your symptoms.  Take over-the-counter and prescription medicines only as told by your health care provider. Do not take aspirin, ibuprofen, or other NSAIDs unless your health care provider told you to do so.  Do not use any tobacco  products, including cigarettes, chewing tobacco, and e-cigarettes. If you need help quitting, ask your health care provider.  Wear loose-fitting clothing. Do not wear anything tight around your waist that causes pressure on your abdomen.  Raise (elevate) the head of your bed about 6 inches (15 cm).  Try to reduce your stress, such as with yoga or meditation. If you need help reducing stress, ask your health care provider.  If you are overweight, reduce your weight to an amount that is healthy for you. Ask your health care provider for guidance about a safe weight loss goal.  Keep all follow-up visits as told by your health care provider. This is important. Contact a health care provider if:  You have new symptoms.  You have unexplained weight loss.  You have difficulty swallowing, or it hurts to swallow.  You have wheezing or a persistent cough.  Your symptoms do not improve with treatment.  You have frequent heartburn for more than two weeks. Get help right away if:  You have pain in your arms, neck, jaw, teeth, or back.  You feel sweaty, dizzy, or light-headed.  You have chest pain or shortness of breath.  You vomit and your vomit looks like blood or coffee grounds.  Your stool is bloody or black. This information is not intended to replace advice given to you by your health care provider. Make sure you discuss any questions you have with your health care provider. Document Released: 10/21/2008 Document Revised: 11/10/2015 Document Reviewed: 09/29/2014 Elsevier Interactive Patient Education  2018 [employer]   Inc. Laparoscopic Cholecystectomy Laparoscopic cholecystectomy is surgery to remove the gallbladder. The gallbladder is a pear-shaped organ that lies beneath the liver on the right side of the body. The gallbladder stores bile, which is a fluid that helps the body to digest fats. Cholecystectomy is often done for inflammation of the gallbladder (cholecystitis). This  condition is usually caused by a buildup of gallstones (cholelithiasis) in the gallbladder. Gallstones can block the flow of bile, which can result in inflammation and pain. In severe cases, emergency surgery may be required. This procedure is done though small incisions in your abdomen (laparoscopic surgery). A thin scope with a camera (laparoscope) is inserted through one incision. Thin surgical instruments are inserted through the other incisions. In some cases, a laparoscopic procedure may be turned into a type of surgery that is done through a larger incision (open surgery). Tell a health care provider about:  Any allergies you have.  All medicines you are taking, including vitamins, herbs, eye drops, creams, and over-the-counter medicines.  Any problems you or family members have had with anesthetic medicines.  Any blood disorders you have.  Any surgeries you have had.  Any medical conditions you have.  Whether you are pregnant or may be pregnant. What are the risks? Generally, this is a safe procedure. However, problems may occur, including:  Infection.  Bleeding.  Allergic reactions to medicines.  Damage to other structures or organs.  A stone remaining in the common bile duct. The common bile duct carries bile from the gallbladder into the small intestine.  A bile leak from the cyst duct that is clipped when your gallbladder is removed.  What happens before the procedure? Staying hydrated Follow instructions from your health care provider about hydration, which may include:  Up to 2 hours before the procedure - you may continue to drink clear liquids, such as water, clear fruit juice, black coffee, and plain tea.  Eating and drinking restrictions Follow instructions from your health care provider about eating and drinking, which may include:  8 hours before the procedure - stop eating heavy meals or foods such as meat, fried foods, or fatty foods.  6 hours before  the procedure - stop eating light meals or foods, such as toast or cereal.  6 hours before the procedure - stop drinking milk or drinks that contain milk.  2 hours before the procedure - stop drinking clear liquids.  Medicines  Ask your health care provider about: ? Changing or stopping your regular medicines. This is especially important if you are taking diabetes medicines or blood thinners. ? Taking medicines such as aspirin and ibuprofen. These medicines can thin your blood. Do not take these medicines before your procedure if your health care provider instructs you not to.  You may be given antibiotic medicine to help prevent infection. General instructions  Let your health care provider know if you develop a cold or an infection before surgery.  Plan to have someone take you home from the hospital or clinic.  Ask your health care provider how your surgical site will be marked or identified. What happens during the procedure?  To reduce your risk of infection: ? Your health care team will wash or sanitize their hands. ? Your skin will be washed with soap. ? Hair may be removed from the surgical area.  An IV tube may be inserted into one of your veins.  You will be given one or more of the following: ? A medicine to help you  relax (sedative). ? A medicine to make you fall asleep (general anesthetic).  A breathing tube will be placed in your mouth.  Your surgeon will make several small cuts (incisions) in your abdomen.  The laparoscope will be inserted through one of the small incisions. The camera on the laparoscope will send images to a TV screen (monitor) in the operating room. This lets your surgeon see inside your abdomen.  Air-like gas will be pumped into your abdomen. This will expand your abdomen to give the surgeon more room to perform the surgery.  Other tools that are needed for the procedure will be inserted through the other incisions. The gallbladder will be  removed through one of the incisions.  Your common bile duct may be examined. If stones are found in the common bile duct, they may be removed.  After your gallbladder has been removed, the incisions will be closed with stitches (sutures), staples, or skin glue.  Your incisions may be covered with a bandage (dressing). The procedure may vary among health care providers and hospitals. What happens after the procedure?  Your blood pressure, heart rate, breathing rate, and blood oxygen level will be monitored until the medicines you were given have worn off.  You will be given medicines as needed to control your pain.  Do not drive for 24 hours if you were given a sedative. This information is not intended to replace advice given to you by your health care provider. Make sure you discuss any questions you have with your health care provider. Document Released: 06/04/2005 Document Revised: 12/25/2015 Document Reviewed: 11/21/2015 Elsevier Interactive Patient Education  2018 [employer]

## 2017-12-10 NOTE — Progress Notes (Signed)
Timothy CrewsBhrayan Ross; [id number]; 04/22/1991   HPI Patient is a 27 year old male who was referred to my care by the emergency room for evaluation and treatment of cholelithiasis.  Patient states he had a severe episode of epigastric pain with heartburn that occurred during the night 2 nights ago.  Nothing seemed to relieve the pain.  He states he has had this intermittently for some time now.  It occurs maybe every week or 2.  It seems to be made worse with eating.  He denies any significant nausea or bloating.  He does not necessarily have fatty food intolerance.  He denies any right upper quadrant abdominal pain with radiation to the flank.  He denies any fever, chills, jaundice.  He currently has no abdominal pain. Past Medical History:  Diagnosis Date  . GERD (gastroesophageal reflux disease)   . History of cardiovascular stress test 2014   normal stress echo    History reviewed. No pertinent surgical history.  Family History  Problem Relation Age of Onset  . Hypertension Mother   . Diabetes Maternal Grandmother     Current Outpatient Medications on File Prior to Visit  Medication Sig Dispense Refill  . HYDROcodone-acetaminophen (NORCO/VICODIN) 5-325 MG tablet 1 or 2 tabs PO q6 hours prn pain 12 tablet 0  . hydrOXYzine (VISTARIL) 50 MG capsule Take 1 capsule (50 mg total) by mouth every 6 (six) hours as needed for anxiety. 60 capsule 4  . ondansetron (ZOFRAN ODT) 4 MG disintegrating tablet Take 1 tablet (4 mg total) by mouth every 8 (eight) hours as needed for nausea or vomiting. 6 tablet 0  . pantoprazole (PROTONIX) 40 MG tablet TAKE 1 TABLET BY MOUTH EVERY DAY 30 tablet 3   No current facility-administered medications on file prior to visit.     No Known Allergies  Social History   Substance and Sexual Activity  Alcohol Use No    Social History   Tobacco Use  Smoking Status Never Smoker  Smokeless Tobacco Never Used    Review of Systems  Constitutional: Negative.   HENT:  Negative.   Eyes: Negative.   Respiratory: Negative.   Cardiovascular: Negative.   Gastrointestinal: Positive for abdominal pain, heartburn and nausea.  Genitourinary: Negative.   Musculoskeletal: Positive for joint pain.  Skin: Negative.   Neurological: Negative.   Endo/Heme/Allergies: Negative.   Psychiatric/Behavioral: Negative.     Objective   Vitals:   12/10/17 1138  BP: 137/86  Pulse: 70  Resp: 18  Temp: 97.7 F (36.5 C)    Physical Exam  Constitutional: He is oriented to person, place, and time. He appears well-developed and well-nourished.  HENT:  Head: Normocephalic and atraumatic.  Eyes: No scleral icterus.  Cardiovascular: Normal rate, regular rhythm and normal heart sounds. Exam reveals no gallop and no friction rub.  No murmur heard. Pulmonary/Chest: Effort normal and breath sounds normal. No stridor. No respiratory distress. He has no wheezes. He has no rales.  Abdominal: Soft. Bowel sounds are normal. He exhibits no distension and no mass. There is no tenderness. There is no guarding.  Neurological: He is alert and oriented to person, place, and time.  Skin: Skin is warm and dry.  Vitals reviewed. ER notes reviewed.  Ultrasound report reviewed.  Labs reviewed.  Assessment  Cholelithiasis, heartburn.  It is difficult to a certain whether he has primary gastroesophageal reflux disease and gastritis versus biliary colic secondary to cholelithiasis.  His symptoms are somewhat atypical for gallbladder disease. Plan   I will  first try to treat him for his gastroesophageal reflux disease.  If this is not successful, or his symptoms worsen, we will proceed with laparoscopic cholecystectomy.  Patient understands and agrees.  He has been started on Prilosec 20 mg p.o. twice daily 1 month.  I will see him again in follow-up in 3 weeks.  He will return sooner if his symptoms worsen.

## 2017-12-20 DIAGNOSIS — K8010 Calculus of gallbladder with chronic cholecystitis without obstruction: Secondary | ICD-10-CM | POA: Diagnosis not present

## 2017-12-28 DIAGNOSIS — R109 Unspecified abdominal pain: Secondary | ICD-10-CM | POA: Diagnosis not present

## 2017-12-28 DIAGNOSIS — R112 Nausea with vomiting, unspecified: Secondary | ICD-10-CM | POA: Diagnosis not present

## 2017-12-28 DIAGNOSIS — R110 Nausea: Secondary | ICD-10-CM | POA: Diagnosis not present

## 2017-12-28 DIAGNOSIS — R197 Diarrhea, unspecified: Secondary | ICD-10-CM | POA: Diagnosis not present

## 2017-12-28 DIAGNOSIS — R1013 Epigastric pain: Secondary | ICD-10-CM | POA: Diagnosis not present

## 2017-12-31 ENCOUNTER — Ambulatory Visit (INDEPENDENT_AMBULATORY_CARE_PROVIDER_SITE_OTHER): Payer: BLUE CROSS/BLUE SHIELD | Admitting: General Surgery

## 2017-12-31 ENCOUNTER — Encounter: Payer: Self-pay | Admitting: General Surgery

## 2017-12-31 VITALS — BP 127/81 | HR 70 | Temp 97.8°F | Resp 18 | Wt 155.0 lb

## 2017-12-31 DIAGNOSIS — K8020 Calculus of gallbladder without cholecystitis without obstruction: Secondary | ICD-10-CM | POA: Diagnosis not present

## 2017-12-31 NOTE — Progress Notes (Signed)
Subjective:     Timothy Ross  Here for follow-up of gallstones.  Patient had an episode of nausea and significant epigastric pain after eating tacos.  This occurred about 30 minutes after he ate.  He denies any fever or chills.  This occurred 4 days ago.  He has been taking his Prilosec as prescribed.  He is still having the intermittent epigastric pain and bloating. Objective:    BP 127/81 (BP Location: Left Arm, Patient Position: Sitting, Cuff Size: Normal)   Pulse 70   Temp 97.8 F (36.6 C) (Temporal)   Resp 18   Wt 155 lb (70.3 kg)   BMI 22.89 kg/m   General:  alert, cooperative and no distress       Assessment:    Cholelithiasis, probable biliary colic.  Gastritis less likely as the Prilosec is not been helpful.    Plan:  We will proceed with laparoscopic cholecystectomy.  The risks and benefits of the procedure including bleeding, infection, hepatobiliary injury, and the possibility of an open procedure as well as recurrence of his symptoms were fully explained to the patient, who gave informed consent.  This has been scheduled for 01/02/2018.

## 2017-12-31 NOTE — Patient Instructions (Signed)

## 2018-01-01 ENCOUNTER — Encounter (HOSPITAL_COMMUNITY)
Admission: RE | Admit: 2018-01-01 | Discharge: 2018-01-01 | Disposition: A | Discharge status: Home / Self Care | Payer: BLUE CROSS/BLUE SHIELD | Source: Ambulatory Visit | Attending: General Surgery | Admitting: General Surgery

## 2018-01-01 ENCOUNTER — Encounter (HOSPITAL_COMMUNITY): Payer: Self-pay

## 2018-01-01 NOTE — H&P (Signed)
Timothy Ross; [id number]; 04/22/1991   HPI Patient is a 27 year old male who was referred to my care by the emergency room for evaluation and treatment of cholelithiasis.  Patient states he had a severe episode of epigastric pain with heartburn that occurred during the night 2 nights ago.  Nothing seemed to relieve the pain.  He states he has had this intermittently for some time now.  It occurs maybe every week or 2.  It seems to be made worse with eating.  He denies any significant nausea or bloating.  He does not necessarily have fatty food intolerance.  He denies any right upper quadrant abdominal pain with radiation to the flank.  He denies any fever, chills, jaundice.  He currently has no abdominal pain. Past Medical History:  Diagnosis Date  . GERD (gastroesophageal reflux disease)   . History of cardiovascular stress test 2014   normal stress echo    History reviewed. No pertinent surgical history.  Family History  Problem Relation Age of Onset  . Hypertension Mother   . Diabetes Maternal Grandmother     Current Outpatient Medications on File Prior to Visit  Medication Sig Dispense Refill  . HYDROcodone-acetaminophen (NORCO/VICODIN) 5-325 MG tablet 1 or 2 tabs PO q6 hours prn pain 12 tablet 0  . hydrOXYzine (VISTARIL) 50 MG capsule Take 1 capsule (50 mg total) by mouth every 6 (six) hours as needed for anxiety. 60 capsule 4  . ondansetron (ZOFRAN ODT) 4 MG disintegrating tablet Take 1 tablet (4 mg total) by mouth every 8 (eight) hours as needed for nausea or vomiting. 6 tablet 0  . pantoprazole (PROTONIX) 40 MG tablet TAKE 1 TABLET BY MOUTH EVERY DAY 30 tablet 3   No current facility-administered medications on file prior to visit.     No Known Allergies  Social History   Substance and Sexual Activity  Alcohol Use No    Social History   Tobacco Use  Smoking Status Never Smoker  Smokeless Tobacco Never Used    Review of Systems  Constitutional: Negative.   HENT:  Negative.   Eyes: Negative.   Respiratory: Negative.   Cardiovascular: Negative.   Gastrointestinal: Positive for abdominal pain, heartburn and nausea.  Genitourinary: Negative.   Musculoskeletal: Positive for joint pain.  Skin: Negative.   Neurological: Negative.   Endo/Heme/Allergies: Negative.   Psychiatric/Behavioral: Negative.     Objective   Vitals:   12/10/17 1138  BP: 137/86  Pulse: 70  Resp: 18  Temp: 97.7 F (36.5 C)    Physical Exam  Constitutional: He is oriented to person, place, and time. He appears well-developed and well-nourished.  HENT:  Head: Normocephalic and atraumatic.  Eyes: No scleral icterus.  Cardiovascular: Normal rate, regular rhythm and normal heart sounds. Exam reveals no gallop and no friction rub.  No murmur heard. Pulmonary/Chest: Effort normal and breath sounds normal. No stridor. No respiratory distress. He has no wheezes. He has no rales.  Abdominal: Soft. Bowel sounds are normal. He exhibits no distension and no mass. There is no tenderness. There is no guarding.  Neurological: He is alert and oriented to person, place, and time.  Skin: Skin is warm and dry.  Vitals reviewed. ER notes reviewed.  Ultrasound report reviewed.  Labs reviewed.  Assessment  Cholelithiasis, heartburn.  It is difficult to a certain whether he has primary gastroesophageal reflux disease and gastritis versus biliary colic secondary to cholelithiasis.  His symptoms are somewhat atypical for gallbladder disease. Plan   I will  first try to treat him for his gastroesophageal reflux disease.  If this is not successful, or his symptoms worsen, we will proceed with laparoscopic cholecystectomy.  The risks and benefits of the procedure including bleeding, infection, hepatobiliary injury, the possibility of an open procedure, and the possibility of recurrence of the symptoms were fully explained to the patient, who gave informed consent.

## 2018-01-02 ENCOUNTER — Ambulatory Visit (HOSPITAL_COMMUNITY)
Admission: RE | Admit: 2018-01-02 | Discharge: 2018-01-02 | Disposition: A | Discharge status: Home / Self Care | Payer: BLUE CROSS/BLUE SHIELD | Source: Ambulatory Visit | Attending: General Surgery | Admitting: General Surgery

## 2018-01-02 ENCOUNTER — Ambulatory Visit (HOSPITAL_COMMUNITY): Payer: BLUE CROSS/BLUE SHIELD | Admitting: Anesthesiology

## 2018-01-02 ENCOUNTER — Encounter (HOSPITAL_COMMUNITY): Payer: Self-pay | Admitting: *Deleted

## 2018-01-02 ENCOUNTER — Encounter (HOSPITAL_COMMUNITY)
Admission: RE | Disposition: A | Discharge status: Home / Self Care | Payer: Self-pay | Source: Ambulatory Visit | Attending: General Surgery

## 2018-01-02 DIAGNOSIS — K8010 Calculus of gallbladder with chronic cholecystitis without obstruction: Secondary | ICD-10-CM | POA: Diagnosis not present

## 2018-01-02 DIAGNOSIS — K8064 Calculus of gallbladder and bile duct with chronic cholecystitis without obstruction: Secondary | ICD-10-CM | POA: Insufficient documentation

## 2018-01-02 DIAGNOSIS — K219 Gastro-esophageal reflux disease without esophagitis: Secondary | ICD-10-CM | POA: Insufficient documentation

## 2018-01-02 DIAGNOSIS — K8020 Calculus of gallbladder without cholecystitis without obstruction: Secondary | ICD-10-CM | POA: Diagnosis not present

## 2018-01-02 DIAGNOSIS — K8050 Calculus of bile duct without cholangitis or cholecystitis without obstruction: Secondary | ICD-10-CM

## 2018-01-02 DIAGNOSIS — Z79899 Other long term (current) drug therapy: Secondary | ICD-10-CM | POA: Diagnosis not present

## 2018-01-02 HISTORY — PX: CHOLECYSTECTOMY: SHX55

## 2018-01-02 SURGERY — LAPAROSCOPIC CHOLECYSTECTOMY
Anesthesia: General

## 2018-01-02 MED ORDER — HEMOSTATIC AGENTS (NO CHARGE) OPTIME
TOPICAL | Status: DC | PRN
  Administered 2018-01-02: 1 via TOPICAL

## 2018-01-02 MED ORDER — LACTATED RINGERS IV SOLN
INTRAVENOUS | Status: DC | PRN
  Administered 2018-01-02 (×2): via INTRAVENOUS

## 2018-01-02 MED ORDER — BUPIVACAINE HCL (PF) 0.5 % IJ SOLN
INTRAMUSCULAR | Status: AC
  Filled 2018-01-02: qty 30

## 2018-01-02 MED ORDER — ROCURONIUM BROMIDE 50 MG/5ML IV SOLN
INTRAVENOUS | Status: AC
  Filled 2018-01-02: qty 1

## 2018-01-02 MED ORDER — POVIDONE-IODINE 10 % EX OINT
TOPICAL_OINTMENT | CUTANEOUS | Status: AC
Start: 2018-01-02 — End: ?
  Filled 2018-01-02: qty 1

## 2018-01-02 MED ORDER — FENTANYL CITRATE (PF) 250 MCG/5ML IJ SOLN
INTRAMUSCULAR | Status: AC
  Filled 2018-01-02: qty 5

## 2018-01-02 MED ORDER — CHLORHEXIDINE GLUCONATE CLOTH 2 % EX PADS: 6.0000 | MEDICATED_PAD | Freq: Once | CUTANEOUS | Status: DC

## 2018-01-02 MED ORDER — SCOPOLAMINE 1 MG/3DAYS TD PT72
MEDICATED_PATCH | TRANSDERMAL | Status: DC | PRN
  Administered 2018-01-02: 1 via TRANSDERMAL

## 2018-01-02 MED ORDER — FENTANYL CITRATE (PF) 100 MCG/2ML IJ SOLN
25.0000 ug | INTRAMUSCULAR | Status: DC | PRN
  Administered 2018-01-02 (×4): 50 ug via INTRAVENOUS
  Filled 2018-01-02 (×2): qty 2

## 2018-01-02 MED ORDER — FENTANYL CITRATE (PF) 100 MCG/2ML IJ SOLN
INTRAMUSCULAR | Status: DC | PRN
  Administered 2018-01-02 (×5): 50 ug via INTRAVENOUS

## 2018-01-02 MED ORDER — SODIUM CHLORIDE 0.9 % IJ SOLN
INTRAMUSCULAR | Status: AC
  Filled 2018-01-02: qty 10

## 2018-01-02 MED ORDER — ONDANSETRON HCL 4 MG/2ML IJ SOLN
INTRAMUSCULAR | Status: AC
  Filled 2018-01-02: qty 2

## 2018-01-02 MED ORDER — DEXAMETHASONE SODIUM PHOSPHATE 4 MG/ML IJ SOLN
INTRAMUSCULAR | Status: AC
  Filled 2018-01-02: qty 1

## 2018-01-02 MED ORDER — BUPIVACAINE HCL (PF) 0.5 % IJ SOLN
INTRAMUSCULAR | Status: DC | PRN
  Administered 2018-01-02: 10 mL

## 2018-01-02 MED ORDER — PROPOFOL 10 MG/ML IV BOLUS
INTRAVENOUS | Status: DC | PRN
  Administered 2018-01-02: 150 mg via INTRAVENOUS

## 2018-01-02 MED ORDER — KETOROLAC TROMETHAMINE 30 MG/ML IJ SOLN
INTRAMUSCULAR | Status: AC
  Filled 2018-01-02: qty 1

## 2018-01-02 MED ORDER — CIPROFLOXACIN IN D5W 400 MG/200ML IV SOLN
400.0000 mg | INTRAVENOUS | Status: AC
  Administered 2018-01-02: 400 mg via INTRAVENOUS

## 2018-01-02 MED ORDER — SUGAMMADEX SODIUM 200 MG/2ML IV SOLN
INTRAVENOUS | Status: DC | PRN
  Administered 2018-01-02: 140.6 mg via INTRAVENOUS

## 2018-01-02 MED ORDER — EPHEDRINE SULFATE 50 MG/ML IJ SOLN
INTRAMUSCULAR | Status: AC
  Filled 2018-01-02: qty 1

## 2018-01-02 MED ORDER — ONDANSETRON HCL 4 MG/2ML IJ SOLN
INTRAMUSCULAR | Status: DC | PRN
  Administered 2018-01-02: 4 mg via INTRAVENOUS

## 2018-01-02 MED ORDER — DEXAMETHASONE SODIUM PHOSPHATE 4 MG/ML IJ SOLN
INTRAMUSCULAR | Status: DC | PRN
  Administered 2018-01-02: 4 mg via INTRAVENOUS

## 2018-01-02 MED ORDER — PROPOFOL 10 MG/ML IV BOLUS
INTRAVENOUS | Status: AC
  Filled 2018-01-02: qty 40

## 2018-01-02 MED ORDER — KETOROLAC TROMETHAMINE 30 MG/ML IJ SOLN: 30.0000 mg | Freq: Once | INTRAMUSCULAR | Status: DC

## 2018-01-02 MED ORDER — ONDANSETRON HCL 4 MG PO TABS: 4.0000 mg | ORAL_TABLET | Freq: Three times a day (TID) | ORAL | 1 refills | Status: DC | PRN

## 2018-01-02 MED ORDER — SUCCINYLCHOLINE CHLORIDE 20 MG/ML IJ SOLN
INTRAMUSCULAR | Status: AC
  Filled 2018-01-02: qty 1

## 2018-01-02 MED ORDER — SODIUM CHLORIDE 0.9 % IR SOLN
Status: DC | PRN
  Administered 2018-01-02: 1000 mL

## 2018-01-02 MED ORDER — SUGAMMADEX SODIUM 200 MG/2ML IV SOLN
INTRAVENOUS | Status: AC
Start: 2018-01-02 — End: ?
  Filled 2018-01-02: qty 2

## 2018-01-02 MED ORDER — HYDROMORPHONE HCL 1 MG/ML IJ SOLN
INTRAMUSCULAR | Status: AC
  Filled 2018-01-02: qty 0.5

## 2018-01-02 MED ORDER — KETOROLAC TROMETHAMINE 30 MG/ML IJ SOLN
INTRAMUSCULAR | Status: DC | PRN
  Administered 2018-01-02: 30 mg via INTRAVENOUS

## 2018-01-02 MED ORDER — MIDAZOLAM HCL 5 MG/5ML IJ SOLN
INTRAMUSCULAR | Status: DC | PRN
  Administered 2018-01-02: 2 mg via INTRAVENOUS

## 2018-01-02 MED ORDER — LACTATED RINGERS IV SOLN: INTRAVENOUS | Status: DC

## 2018-01-02 MED ORDER — PHENYLEPHRINE 40 MCG/ML (10ML) SYRINGE FOR IV PUSH (FOR BLOOD PRESSURE SUPPORT)
PREFILLED_SYRINGE | INTRAVENOUS | Status: AC
  Filled 2018-01-02: qty 10

## 2018-01-02 MED ORDER — HYDROCODONE-ACETAMINOPHEN 7.5-325 MG PO TABS: 1.0000 | ORAL_TABLET | Freq: Once | ORAL | Status: DC | PRN

## 2018-01-02 MED ORDER — CIPROFLOXACIN IN D5W 400 MG/200ML IV SOLN
INTRAVENOUS | Status: AC
  Filled 2018-01-02: qty 200

## 2018-01-02 MED ORDER — HYDROCODONE-ACETAMINOPHEN 5-325 MG PO TABS: 1.0000 | ORAL_TABLET | ORAL | 0 refills | Status: DC | PRN

## 2018-01-02 MED ORDER — HYDROMORPHONE HCL 1 MG/ML IJ SOLN
0.5000 mg | INTRAMUSCULAR | Status: DC | PRN
  Administered 2018-01-02: 0.5 mg via INTRAVENOUS

## 2018-01-02 MED ORDER — POVIDONE-IODINE 10 % OINT PACKET
TOPICAL_OINTMENT | CUTANEOUS | Status: DC | PRN
  Administered 2018-01-02: 1 via TOPICAL

## 2018-01-02 MED ORDER — LIDOCAINE HCL (CARDIAC) PF 100 MG/5ML IV SOSY
PREFILLED_SYRINGE | INTRAVENOUS | Status: DC | PRN
  Administered 2018-01-02: 40 mg via INTRAVENOUS

## 2018-01-02 MED ORDER — MIDAZOLAM HCL 2 MG/2ML IJ SOLN
INTRAMUSCULAR | Status: AC
  Filled 2018-01-02: qty 2

## 2018-01-02 MED ORDER — SCOPOLAMINE 1 MG/3DAYS TD PT72
MEDICATED_PATCH | TRANSDERMAL | Status: AC
  Filled 2018-01-02: qty 1

## 2018-01-02 MED ORDER — ROCURONIUM BROMIDE 100 MG/10ML IV SOLN
INTRAVENOUS | Status: DC | PRN
  Administered 2018-01-02: 40 mg via INTRAVENOUS

## 2018-01-02 MED ORDER — LIDOCAINE HCL (PF) 1 % IJ SOLN
INTRAMUSCULAR | Status: AC
  Filled 2018-01-02: qty 5

## 2018-01-02 SURGICAL SUPPLY — 42 items
APPLIER CLIP ROT 10 11.4 M/L (STAPLE) ×2
BAG RETRIEVAL 10 (BASKET) ×1
CHLORAPREP W/TINT 26ML (MISCELLANEOUS) ×2 IMPLANT
CLIP APPLIE ROT 10 11.4 M/L (STAPLE) ×1 IMPLANT
CLOTH BEACON ORANGE TIMEOUT ST (SAFETY) ×2 IMPLANT
COVER LIGHT HANDLE STERIS (MISCELLANEOUS) ×4 IMPLANT
DECANTER SPIKE VIAL GLASS SM (MISCELLANEOUS) ×2 IMPLANT
ELECT REM PT RETURN 9FT ADLT (ELECTROSURGICAL) ×2
ELECTRODE REM PT RTRN 9FT ADLT (ELECTROSURGICAL) ×1 IMPLANT
FILTER SMOKE EVAC LAPAROSHD (FILTER) ×2 IMPLANT
GLOVE BIO SURGEON STRL SZ 6.5 (GLOVE) ×2 IMPLANT
GLOVE BIOGEL PI IND STRL 6.5 (GLOVE) ×1 IMPLANT
GLOVE BIOGEL PI IND STRL 7.0 (GLOVE) ×2 IMPLANT
GLOVE BIOGEL PI INDICATOR 6.5 (GLOVE) ×1
GLOVE BIOGEL PI INDICATOR 7.0 (GLOVE) ×2
GLOVE ECLIPSE 6.5 STRL STRAW (GLOVE) ×2 IMPLANT
GLOVE SURG SS PI 7.5 STRL IVOR (GLOVE) ×2 IMPLANT
GOWN STRL REUS W/ TWL XL LVL3 (GOWN DISPOSABLE) ×1 IMPLANT
GOWN STRL REUS W/TWL LRG LVL3 (GOWN DISPOSABLE) ×4 IMPLANT
GOWN STRL REUS W/TWL XL LVL3 (GOWN DISPOSABLE) ×1
HEMOSTAT SNOW SURGICEL 2X4 (HEMOSTASIS) ×2 IMPLANT
INST SET LAPROSCOPIC AP (KITS) ×2 IMPLANT
KIT TURNOVER KIT A (KITS) ×2 IMPLANT
MANIFOLD NEPTUNE II (INSTRUMENTS) ×2 IMPLANT
NEEDLE INSUFFLATION 14GA 120MM (NEEDLE) ×2 IMPLANT
NS IRRIG 1000ML POUR BTL (IV SOLUTION) ×2 IMPLANT
PACK LAP CHOLE LZT030E (CUSTOM PROCEDURE TRAY) ×2 IMPLANT
PAD ARMBOARD 7.5X6 YLW CONV (MISCELLANEOUS) ×2 IMPLANT
SET BASIN LINEN APH (SET/KITS/TRAYS/PACK) ×2 IMPLANT
SLEEVE ENDOPATH XCEL 5M (ENDOMECHANICALS) ×2 IMPLANT
SPONGE GAUZE 2X2 8PLY STRL LF (GAUZE/BANDAGES/DRESSINGS) ×8 IMPLANT
STAPLER VISISTAT (STAPLE) ×2 IMPLANT
SUT VICRYL 0 UR6 27IN ABS (SUTURE) ×2 IMPLANT
SYS BAG RETRIEVAL 10MM (BASKET) ×1
SYSTEM BAG RETRIEVAL 10MM (BASKET) ×1 IMPLANT
TAPE CLOTH SURG 4X10 WHT LF (GAUZE/BANDAGES/DRESSINGS) ×2 IMPLANT
TROCAR ENDO BLADELESS 11MM (ENDOMECHANICALS) ×2 IMPLANT
TROCAR XCEL NON-BLD 5MMX100MML (ENDOMECHANICALS) ×2 IMPLANT
TROCAR XCEL UNIV SLVE 11M 100M (ENDOMECHANICALS) ×2 IMPLANT
TUBE CONNECTING 12X1/4 (SUCTIONS) ×2 IMPLANT
TUBING INSUFFLATION (TUBING) ×2 IMPLANT
WARMER LAPAROSCOPE (MISCELLANEOUS) ×2 IMPLANT

## 2018-01-02 NOTE — Discharge Instructions (Signed)
Laparoscopic Cholecystectomy, Care After °This sheet gives you information about how to care for yourself after your procedure. Your health care provider may also give you more specific instructions. If you have problems or questions, contact your health care provider. °What can I expect after the procedure? °After the procedure, it is common to have: °· Pain at your incision sites. You will be given medicines to control this pain. °· Mild nausea or vomiting. °· Bloating and possible shoulder pain from the air-like gas that was used during the procedure. ° °Follow these instructions at home: °Incision care ° °· Follow instructions from your health care provider about how to take care of your incisions. Make sure you: °? Wash your hands with soap and water before you change your bandage (dressing). If soap and water are not available, use hand sanitizer. °? Change your dressing as told by your health care provider. °? Leave stitches (sutures), skin glue, or adhesive strips in place. These skin closures may need to be in place for 2 weeks or longer. If adhesive strip edges start to loosen and curl up, you may trim the loose edges. Do not remove adhesive strips completely unless your health care provider tells you to do that. °· Do not take baths, swim, or use a hot tub until your health care provider approves. Ask your health care provider if you can take showers. You may only be allowed to take sponge baths for bathing. °· Check your incision area every day for signs of infection. Check for: °? More redness, swelling, or pain. °? More fluid or blood. °? Warmth. °? Pus or a bad smell. °Activity °· Do not drive or use heavy machinery while taking prescription pain medicine. °· Do not lift anything that is heavier than 10 lb (4.5 kg) until your health care provider approves. °· Do not play contact sports until your health care provider approves. °· Do not drive for 24 hours if you were given a medicine to help you relax  (sedative). °· Rest as needed. Do not return to work or school until your health care provider approves. °General instructions °· Take over-the-counter and prescription medicines only as told by your health care provider. °· To prevent or treat constipation while you are taking prescription pain medicine, your health care provider may recommend that you: °? Drink enough fluid to keep your urine clear or pale yellow. °? Take over-the-counter or prescription medicines. °? Eat foods that are high in fiber, such as fresh fruits and vegetables, whole grains, and beans. °? Limit foods that are high in fat and processed sugars, such as fried and sweet foods. °Contact a health care provider if: °· You develop a rash. °· You have more redness, swelling, or pain around your incisions. °· You have more fluid or blood coming from your incisions. °· Your incisions feel warm to the touch. °· You have pus or a bad smell coming from your incisions. °· You have a fever. °· One or more of your incisions breaks open. °Get help right away if: °· You have trouble breathing. °· You have chest pain. °· You have increasing pain in your shoulders. °· You faint or feel dizzy when you stand. °· You have severe pain in your abdomen. °· You have nausea or vomiting that lasts for more than one day. °· You have leg pain. °This information is not intended to replace advice given to you by your health care provider. Make sure you discuss any questions you   have with your health care provider. °Document Released: 06/04/2005 Document Revised: 12/24/2015 Document Reviewed: 11/21/2015 °Elsevier Interactive Patient Education © 2018 [employer] ° °

## 2018-01-02 NOTE — Anesthesia Procedure Notes (Signed)
Procedure Name: Intubation Date/Time: 01/02/2018 7:38 AM Performed by: Pernell DupreAdams, Amy A, CRNA Pre-anesthesia Checklist: Patient identified, Patient being monitored, Timeout performed, Emergency Drugs available and Suction available Patient Re-evaluated:Patient Re-evaluated prior to induction Oxygen Delivery Method: Circle System Utilized Preoxygenation: Pre-oxygenation with 100% oxygen Induction Type: IV induction Ventilation: Mask ventilation without difficulty Laryngoscope Size: Miller and 3 Grade View: Grade I Tube type: Oral Tube size: 7.0 mm Number of attempts: 1 Airway Equipment and Method: Stylet Placement Confirmation: ETT inserted through vocal cords under direct vision,  positive ETCO2 and breath sounds checked- equal and bilateral Secured at: 21 cm Tube secured with: Tape Dental Injury: Teeth and Oropharynx as per pre-operative assessment

## 2018-01-02 NOTE — Anesthesia Preprocedure Evaluation (Signed)
Anesthesia Evaluation  Patient identified by MRN, date of birth, ID band Patient awake    Reviewed: Allergy & Precautions, NPO status , Patient's Chart, lab work & pertinent test results  Airway Mallampati: I  TM Distance: >3 FB Neck ROM: Full    Dental no notable dental hx. (+) Teeth Intact   Pulmonary neg pulmonary ROS,    Pulmonary exam normal breath sounds clear to auscultation       Cardiovascular Exercise Tolerance: Good negative cardio ROS Normal cardiovascular examI Rhythm:Regular Rate:Normal     Neuro/Psych Anxiety negative neurological ROS  negative psych ROS   GI/Hepatic negative GI ROS, Neg liver ROS, GERD  Medicated and Controlled,  Endo/Other  negative endocrine ROS  Renal/GU negative Renal ROS  negative genitourinary   Musculoskeletal negative musculoskeletal ROS (+)   Abdominal   Peds negative pediatric ROS (+)  Hematology negative hematology ROS (+)   Anesthesia Other Findings   Reproductive/Obstetrics negative OB ROS                             Anesthesia Physical Anesthesia Plan  ASA: II  Anesthesia Plan: General   Post-op Pain Management:    Induction: Intravenous  PONV Risk Score and Plan:   Airway Management Planned: Oral ETT  Additional Equipment:   Intra-op Plan:   Post-operative Plan: Extubation in OR  Informed Consent: I have reviewed the patients History and Physical, chart, labs and discussed the procedure including the risks, benefits and alternatives for the proposed anesthesia with the patient or authorized representative who has indicated his/her understanding and acceptance.   Dental advisory given  Plan Discussed with: CRNA  Anesthesia Plan Comments:         Anesthesia Quick Evaluation

## 2018-01-02 NOTE — Anesthesia Postprocedure Evaluation (Signed)
Anesthesia Post Note  Patient: Timothy Ross  Procedure(s) Performed: LAPAROSCOPIC CHOLECYSTECTOMY (N/A )  Patient location during evaluation: PACU Anesthesia Type: General Level of consciousness: awake and alert and oriented Pain management: pain level controlled Vital Signs Assessment: post-procedure vital signs reviewed and stable Respiratory status: spontaneous breathing Cardiovascular status: stable Postop Assessment: no apparent nausea or vomiting Anesthetic complications: no     Last Vitals:  Vitals:   01/02/18 0830 01/02/18 0845  BP: (!) 124/104   Pulse: 81 84  Resp: 16 20  Temp:    SpO2: 94% 99%    Last Pain:  Vitals:   01/02/18 0828  TempSrc:   PainSc: 6                  ADAMS, AMY A

## 2018-01-02 NOTE — Interval H&P Note (Signed)
History and Physical Interval Note:  01/02/2018 7:04 AM  Timothy Ross  has presented today for surgery, with the diagnosis of cholelithiasis  The various methods of treatment have been discussed with the patient and family. After consideration of risks, benefits and other options for treatment, the patient has consented to  Procedure(s): LAPAROSCOPIC CHOLECYSTECTOMY (N/A) as a surgical intervention .  The patient's history has been reviewed, patient examined, no change in status, stable for surgery.  I have reviewed the patient's chart and labs.  Questions were answered to the patient's satisfaction.     Franky MachoMark Angelito Hopping

## 2018-01-02 NOTE — Transfer of Care (Signed)
Immediate Anesthesia Transfer of Care Note  Patient: Timothy Ross  Procedure(s) Performed: LAPAROSCOPIC CHOLECYSTECTOMY (N/A )  Patient Location: PACU  Anesthesia Type:General  Level of Consciousness: awake, oriented and patient cooperative  Airway & Oxygen Therapy: Patient Spontanous Breathing and Patient connected to face mask oxygen  Post-op Assessment: Report given to RN and Post -op Vital signs reviewed and stable  Post vital signs: Reviewed and stable  Last Vitals:  Vitals Value Taken Time  BP    Temp    Pulse 83 01/02/2018  8:28 AM  Resp 16 01/02/2018  8:28 AM  SpO2 99 % 01/02/2018  8:28 AM  Vitals shown include unvalidated device data.  Last Pain:  Vitals:   01/02/18 0648  TempSrc: Oral  PainSc: 0-No pain      Patients Stated Pain Goal: 6 (01/02/18 [id number])  Complications: No apparent anesthesia complications

## 2018-01-02 NOTE — Op Note (Signed)
Patient:  Timothy CrewsBhrayan Melle  DOB:  10-30-1990  MRN:  [id number]   Preop Diagnosis: Biliary colic, cholelithiasis  Postop Diagnosis: Same  Procedure: Laparoscopic cholecystectomy  Surgeon: Franky MachoMark Alto Gandolfo, MD   Assistant: Larae GroomsLindsey Bridges, MD  Anes: General endotracheal  Indications: Patient is a 27 year old male who presents with biliary colic secondary to cholelithiasis.  The risks and benefits of the procedure including bleeding, infection, hepatobiliary injury, and the possibility of an open procedure were fully explained to the patient, who gave informed consent.  Procedure note: The patient was placed in the supine position.  After induction of general endotracheal anesthesia, the abdomen was prepped and draped using the usual sterile technique with DuraPrep.  Surgical site confirmation was performed.  A supraumbilical incision was made down to the fascia.  A Veress needle was introduced into the abdominal cavity and confirmation of placement was done using the saline drop test.  The abdomen was then insufflated to 16 mmHg pressure.  An 11 mm trocar was introduced into the abdominal cavity under direct visualization without difficulty.  Patient was placed in reverse Trendelenburg position and an additional 11 mm trocar was placed in the epigastric region and 5 mm trochars were placed in the right upper quadrant and right flank regions.  The liver was inspected and noted to be within normal limits.  The gallbladder was retracted in a dynamic fashion in order to provide a critical view of the triangle of Calot.  The cystic duct was first identified.  Its juncture to the infundibulum was fully identified.  Endoclips placed proximally and distally on the cystic duct, and the cystic duct was divided.  This was likewise done to the cystic artery.  The gallbladder was freed away from the gallbladder fossa using Bovie electrocautery.  Gallbladder was delivered through the epigastric trocar site using an  Endo Catch bag.  The gallbladder fossa was inspected and no abnormal bleeding or bile leakage was noted.  Surgicel was placed in the gallbladder fossa.  All fluid and air were then evacuated from the abdominal cavity prior to the removal of the trochars.  All wounds were irrigated with normal saline.  All wounds were injected with 0.5% Sensorcaine.  The supraumbilical fascia as well as epigastric fascia were reapproximated using 0 Vicryl interrupted sutures.  All skin incisions were closed using staples.  Betadine ointment and dry sterile dressings were applied.  All tape and needle counts were correct at the end of the procedure.  The patient was extubated in the operating room and transferred to PACU in stable condition.  Dr. Henreitta LeberBridges was assisting throughout the procedure.  Complications: None  EBL: Minimal  Specimen: Gallbladder

## 2018-01-03 ENCOUNTER — Encounter (HOSPITAL_COMMUNITY): Payer: Self-pay | Admitting: General Surgery

## 2018-01-03 LAB — H. PYLORI ANTIBODY, IGG

## 2018-01-14 ENCOUNTER — Encounter: Payer: Self-pay | Admitting: General Surgery

## 2018-01-14 ENCOUNTER — Ambulatory Visit (INDEPENDENT_AMBULATORY_CARE_PROVIDER_SITE_OTHER): Payer: Self-pay | Admitting: General Surgery

## 2018-01-14 VITALS — BP 122/91 | HR 69 | Temp 98.6°F | Resp 16 | Wt 152.0 lb

## 2018-01-14 DIAGNOSIS — Z09 Encounter for follow-up examination after completed treatment for conditions other than malignant neoplasm: Secondary | ICD-10-CM

## 2018-01-14 NOTE — Progress Notes (Signed)
Subjective:     Timothy CrewsBhrayan Ross  Status post laparoscopic cholecystectomy.  Doing well.  Has no complaints. Objective:    BP (!) 122/91 (BP Location: Left Arm, Patient Position: Sitting, Cuff Size: Normal)   Pulse 69   Temp 98.6 F (37 C) (Temporal)   Resp 16   Wt 152 lb (68.9 kg)   BMI 22.45 kg/m   General:  alert, cooperative and no distress  Abdomen soft, incisions healing well.  Staples removed. Final pathology consistent with diagnosis. H. pylori serology negative.     Assessment:    Doing well postoperatively.    Plan:   May return to work without restrictions.  Follow-up here as needed.

## 2018-08-15 ENCOUNTER — Encounter: Payer: Self-pay | Admitting: Family

## 2018-08-15 ENCOUNTER — Ambulatory Visit: Payer: BLUE CROSS/BLUE SHIELD | Admitting: Family

## 2018-08-15 VITALS — BP 122/86 | HR 83 | Temp 98.5°F | Ht 69.0 in | Wt 159.2 lb

## 2018-08-15 DIAGNOSIS — J208 Acute bronchitis due to other specified organisms: Secondary | ICD-10-CM

## 2018-08-15 DIAGNOSIS — B9689 Other specified bacterial agents as the cause of diseases classified elsewhere: Secondary | ICD-10-CM | POA: Diagnosis not present

## 2018-08-15 MED ORDER — AZITHROMYCIN 250 MG PO TABS: ORAL_TABLET | ORAL | 0 refills | Status: DC

## 2018-08-15 MED ORDER — BENZONATATE 200 MG PO CAPS: 200.0000 mg | ORAL_CAPSULE | Freq: Three times a day (TID) | ORAL | 1 refills | Status: DC | PRN

## 2018-08-15 NOTE — Progress Notes (Signed)
Subjective:    Patient ID: Timothy Ross, male    DOB: 1990/11/02, 28 y.o.   MRN: [id number]  Chief Complaint  Patient presents with  . Cough    had for two weeks  . Generalized Body Aches    Cough  This is a new problem. The current episode started 1 to 4 weeks ago. The problem has been gradually worsening. The problem occurs every few minutes. The cough is productive of sputum. Associated symptoms include chills, myalgias, nasal congestion, a sore throat and wheezing. Pertinent negatives include no ear congestion, ear pain, fever or shortness of breath. The symptoms are aggravated by lying down. He has tried rest and OTC cough suppressant for the symptoms.      Review of Systems  Constitutional: Positive for chills. Negative for fever.  HENT: Positive for sore throat. Negative for ear pain.   Respiratory: Positive for cough and wheezing. Negative for shortness of breath.   Musculoskeletal: Positive for myalgias.  All other systems reviewed and are negative.      Objective:   Physical Exam Vitals signs reviewed.  Constitutional:      General: He is not in acute distress.    Appearance: He is well-developed.  HENT:     Head: Normocephalic.     Right Ear: Tympanic membrane is erythematous.     Left Ear: Tympanic membrane is erythematous.     Nose: Mucosal edema present.     Mouth/Throat:     Pharynx: Posterior oropharyngeal erythema present.  Eyes:     General:        Right eye: No discharge.        Left eye: No discharge.     Pupils: Pupils are equal, round, and reactive to light.  Neck:     Musculoskeletal: Normal range of motion and neck supple.     Thyroid: No thyromegaly.  Cardiovascular:     Rate and Rhythm: Normal rate and regular rhythm.     Heart sounds: Normal heart sounds. No murmur.  Pulmonary:     Effort: Pulmonary effort is normal. No respiratory distress.     Breath sounds: Normal breath sounds. No wheezing.     Comments: Intermittent  nonproductive cough Abdominal:     General: Bowel sounds are normal. There is no distension.     Palpations: Abdomen is soft.     Tenderness: There is no abdominal tenderness.  Musculoskeletal: Normal range of motion.        General: No tenderness.  Skin:    General: Skin is warm and dry.     Findings: No erythema or rash.  Neurological:     Mental Status: He is alert and oriented to person, place, and time.     Cranial Nerves: No cranial nerve deficit.     Deep Tendon Reflexes: Reflexes are normal and symmetric.  Psychiatric:        Behavior: Behavior normal.        Thought Content: Thought content normal.        Judgment: Judgment normal.       BP 122/86   Pulse 83   Temp 98.5 F (36.9 C) (Oral)   Ht 5\' 9"  (1.753 m)   Wt 159 lb 3.2 oz (72.2 kg)   BMI 23.51 kg/m      Assessment & Plan:  Timothy Ross comes in today with chief complaint of Cough (had for two weeks) and Generalized Body Aches   Diagnosis and orders addressed:  1.  Acute bacterial bronchitis - Take meds as prescribed - Use a cool mist humidifier  -Use saline nose sprays frequently -Force fluids -For any cough or congestion  Use plain Mucinex- regular strength or max strength is fine -For fever or aces or pains- take tylenol or ibuprofen. -Throat lozenges if help -New toothbrush in 3 days RTO if symptoms worsens or do not improve  - azithromycin (ZITHROMAX) 250 MG tablet; Take 500 mg once, then 250 mg for four days  Dispense: 6 tablet; Refill: 0 - benzonatate (TESSALON) 200 MG capsule; Take 1 capsule (200 mg total) by mouth 3 (three) times daily as needed.  Dispense: 30 capsule; Refill: 1   Jannifer Rodney, FNP

## 2018-08-15 NOTE — Patient Instructions (Signed)

## 2018-09-08 DIAGNOSIS — Z3141 Encounter for fertility testing: Secondary | ICD-10-CM | POA: Diagnosis not present

## 2018-10-07 IMAGING — DX DG CHEST 2V
2 series · 2 of 2 positions shown · non-contrast
Comparison: None.

CLINICAL DATA: Dyspnea

EXAM:
CHEST  2 VIEW

[chest pa]
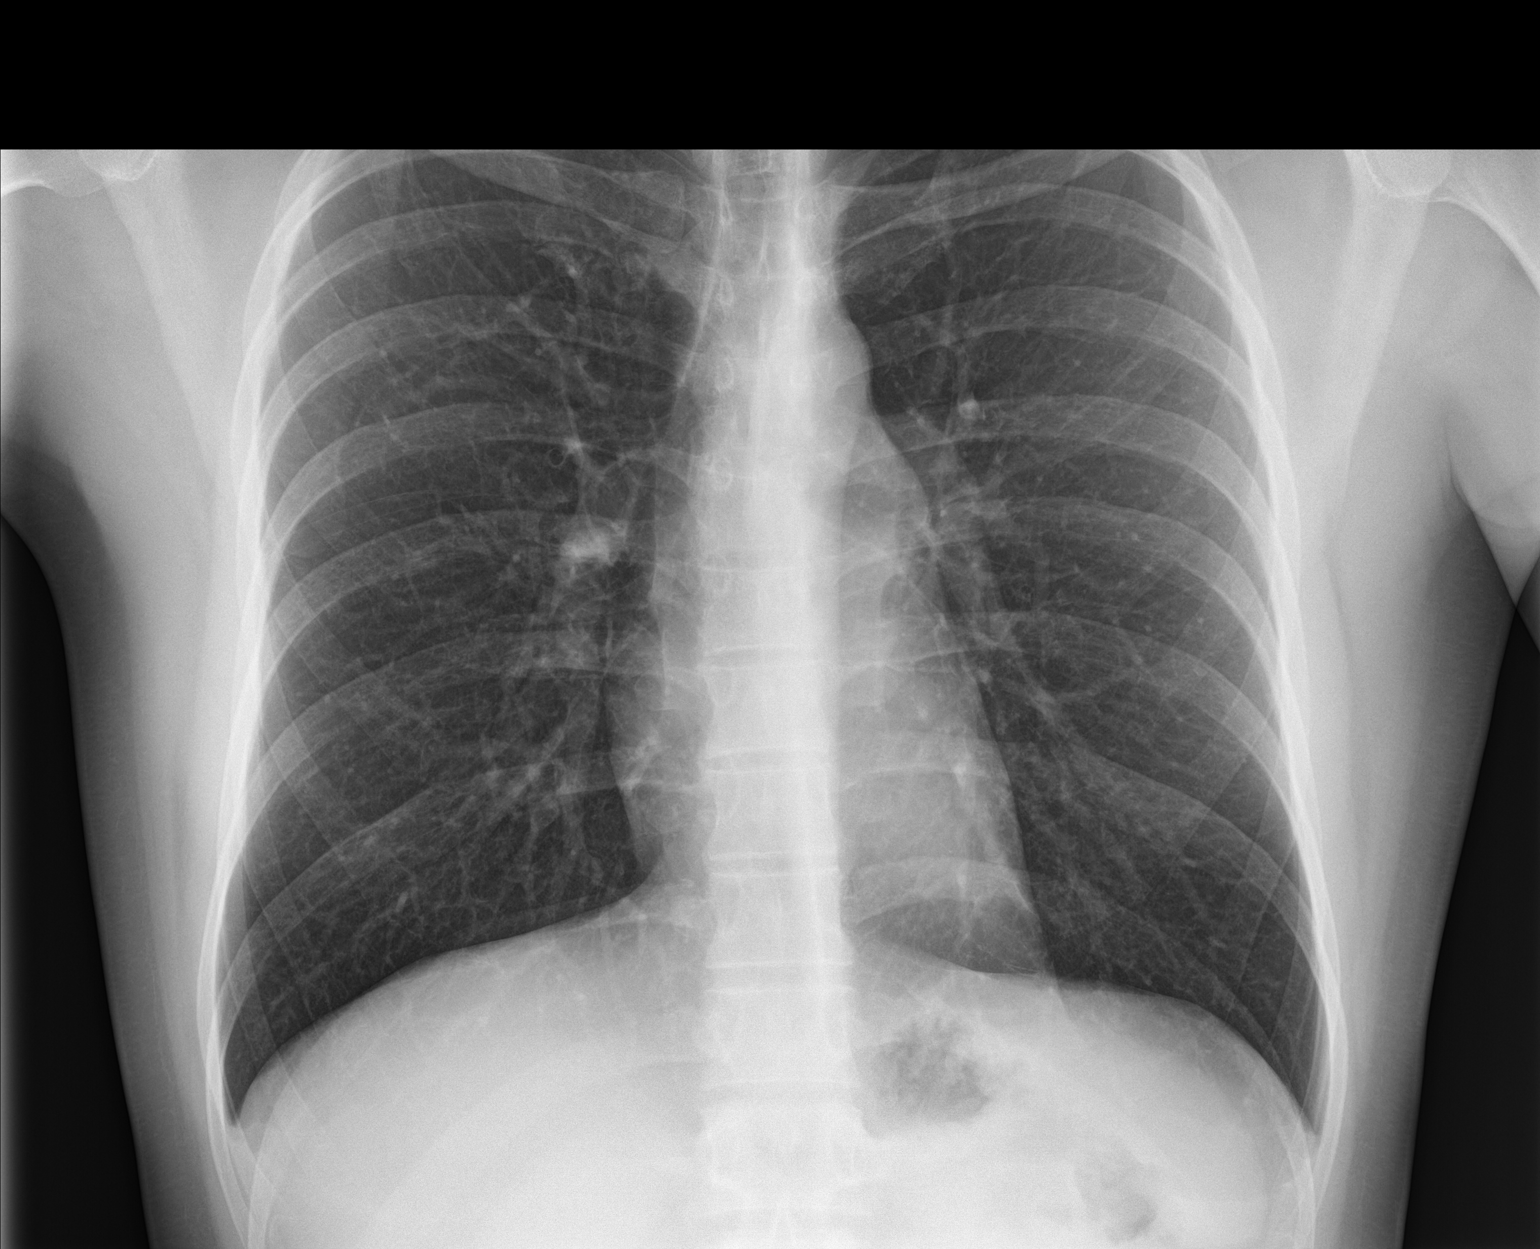

[chest lat]
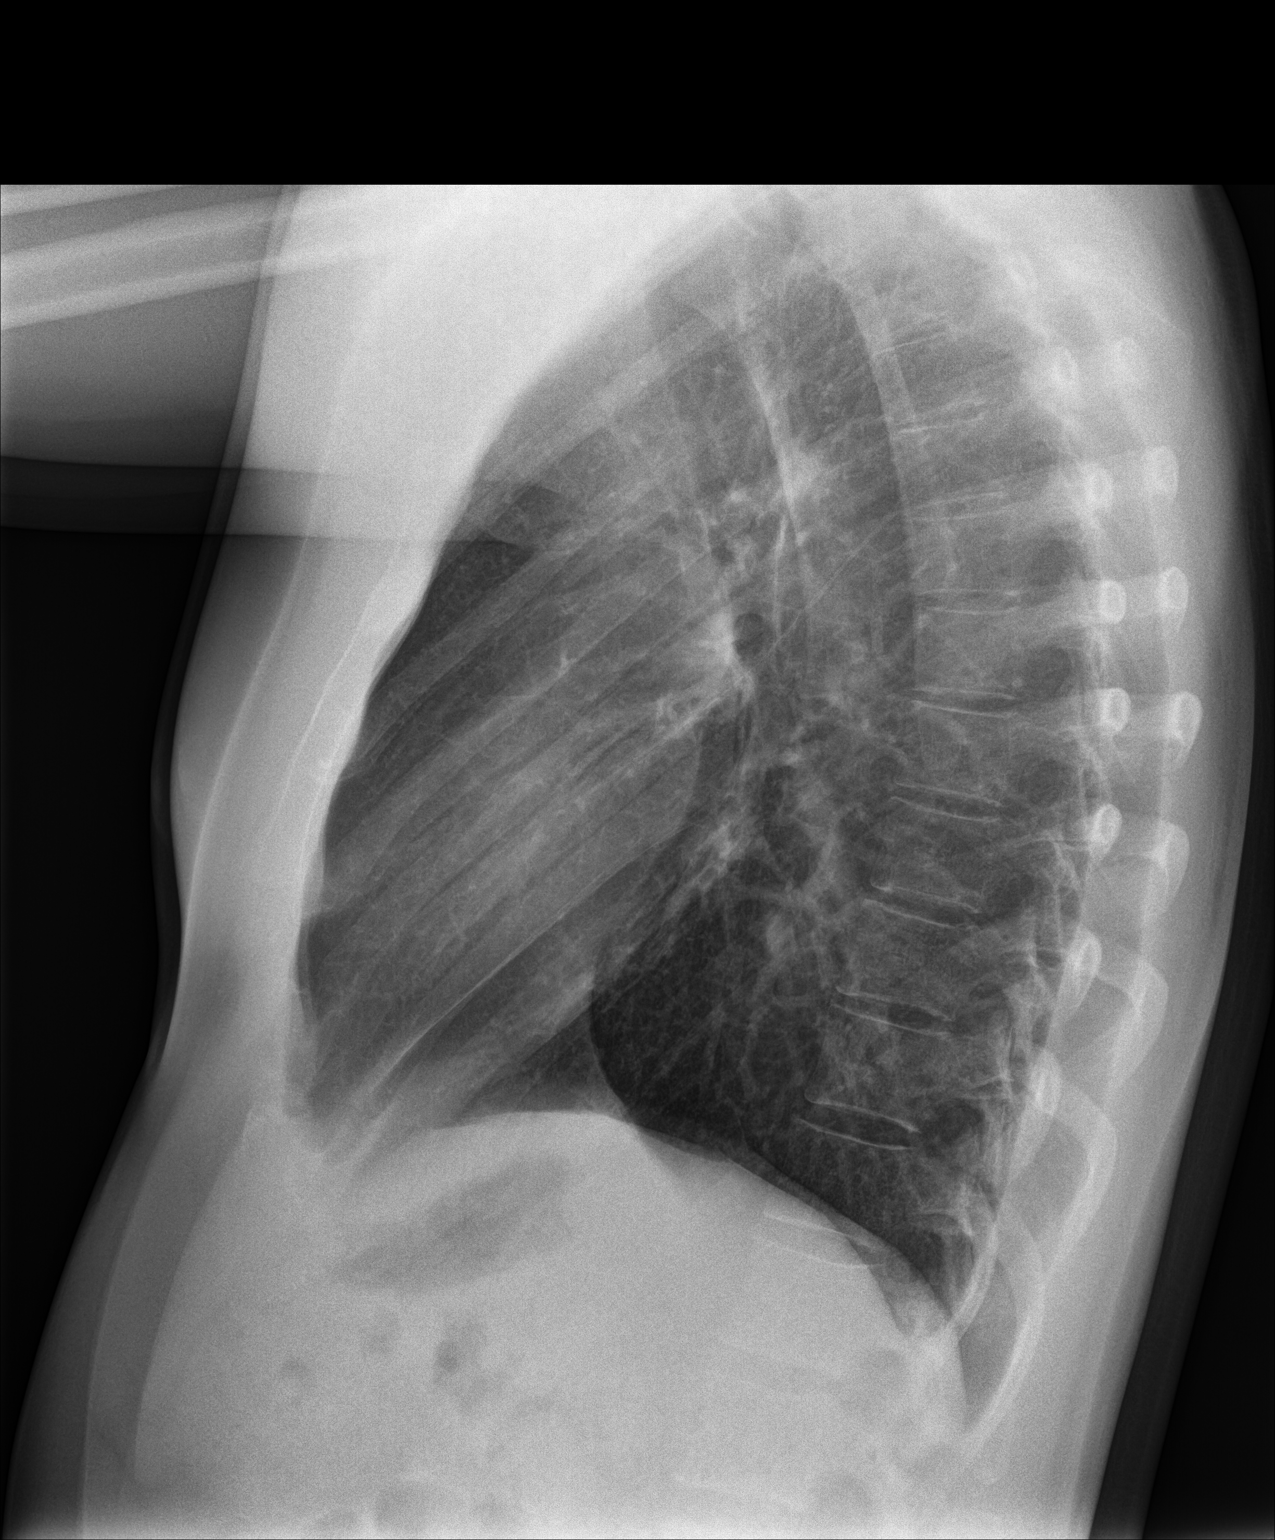

[2 of 2 positions shown; findings below may reference images not displayed]

FINDINGS: The heart size and mediastinal contours are within normal limits.
Both lungs are clear. The visualized skeletal structures are
unremarkable.
IMPRESSION: No active cardiopulmonary disease.

## 2019-07-24 ENCOUNTER — Ambulatory Visit (INDEPENDENT_AMBULATORY_CARE_PROVIDER_SITE_OTHER): Payer: BC Managed Care – PPO | Admitting: Family Medicine

## 2019-07-24 ENCOUNTER — Encounter: Payer: Self-pay | Admitting: Family Medicine

## 2019-07-24 DIAGNOSIS — R21 Rash and other nonspecific skin eruption: Secondary | ICD-10-CM | POA: Diagnosis not present

## 2019-07-24 MED ORDER — TRIAMCINOLONE ACETONIDE 0.1 % EX CREA: 1.0000 "application " | TOPICAL_CREAM | Freq: Two times a day (BID) | CUTANEOUS | 0 refills | Status: DC

## 2019-07-24 MED ORDER — CETIRIZINE HCL 10 MG PO TABS: 10.0000 mg | ORAL_TABLET | Freq: Every day | ORAL | 11 refills | Status: DC

## 2019-07-24 MED ORDER — PREDNISONE 10 MG (21) PO TBPK: ORAL_TABLET | ORAL | 0 refills | Status: DC

## 2019-07-24 NOTE — Progress Notes (Signed)
Virtual Visit via telephone Note Due to COVID-19 pandemic this visit was conducted virtually. This visit type was conducted due to national recommendations for restrictions regarding the COVID-19 Pandemic (e.g. social distancing, sheltering in place) in an effort to limit this patient's exposure and mitigate transmission in our community. All issues noted in this document were discussed and addressed.  A physical exam was not performed with this format.   I connected with Timothy Ross on 07/24/2019 at [facility] by telephone and verified that I am speaking with the correct person using two identifiers. Timothy Ross is currently located at home and family is currently with them during visit. The provider, Monia Pouch, FNP is located in their office at time of visit.  I discussed the limitations, risks, security and privacy concerns of performing an evaluation and management service by telephone and the availability of in person appointments. I also discussed with the patient that there may be a patient responsible charge related to this service. The patient expressed understanding and agreed to proceed.  Subjective:  Patient ID: Timothy Ross, male    DOB: [birth year], 29 y.o.   MRN: [id number]  Chief Complaint:  Rash   HPI: Alpha Mysliwiec is a 29 y.o. male presenting on 07/24/2019 for Rash   Rash This is a new problem. The current episode started in the past 7 days. The problem has been gradually worsening since onset. The affected locations include the left axilla, left arm, right axilla and right arm. The rash is characterized by itchiness and redness. Associated with: new clothing. Pertinent negatives include no anorexia, congestion, cough, diarrhea, eye pain, facial edema, fatigue, fever, joint pain, nail changes, rhinorrhea, shortness of breath, sore throat or vomiting. Past treatments include anti-itch cream. The treatment provided no relief.     Relevant past medical, surgical, family,  and social history reviewed and updated as indicated.  Allergies and medications reviewed and updated.   Past Medical History:  Diagnosis Date  . GERD (gastroesophageal reflux disease)   . History of cardiovascular stress test 2014   normal stress echo    Past Surgical History:  Procedure Laterality Date  . CHOLECYSTECTOMY N/A 01/02/2018   Procedure: LAPAROSCOPIC CHOLECYSTECTOMY;  Surgeon: Aviva Signs, MD;  Location: [facility];  Service: General;  Laterality: N/A;    Social History   Socioeconomic History  . Marital status: Single    Spouse name: Not on file  . Number of children: Not on file  . Years of education: Not on file  . Highest education level: Not on file  Occupational History  . Not on file  Tobacco Use  . Smoking status: Never Smoker  . Smokeless tobacco: Never Used  Substance and Sexual Activity  . Alcohol use: No  . Drug use: No  . Sexual activity: Yes  Other Topics Concern  . Not on file  Social History Narrative  . Not on file   Social Determinants of Health   Financial Resource Strain:   . Difficulty of Paying Living Expenses: Not on file  Food Insecurity:   . Worried About Charity fundraiser in the Last Year: Not on file  . Ran Out of Food in the Last Year: Not on file  Transportation Needs:   . Lack of Transportation (Medical): Not on file  . Lack of Transportation (Non-Medical): Not on file  Physical Activity:   . Days of Exercise per Week: Not on file  . Minutes of Exercise per Session: Not on file  Stress:   .  Feeling of Stress : Not on file  Social Connections:   . Frequency of Communication with Friends and Family: Not on file  . Frequency of Social Gatherings with Friends and Family: Not on file  . Attends Religious Services: Not on file  . Active Member of Clubs or Organizations: Not on file  . Attends Banker Meetings: Not on file  . Marital Status: Not on file  Intimate Partner Violence:   . Fear of Current or  Ex-Partner: Not on file  . Emotionally Abused: Not on file  . Physically Abused: Not on file  . Sexually Abused: Not on file    Outpatient Encounter Medications as of 07/24/2019  Medication Sig  . azithromycin (ZITHROMAX) 250 MG tablet Take 500 mg once, then 250 mg for four days  . benzonatate (TESSALON) 200 MG capsule Take 1 capsule (200 mg total) by mouth 3 (three) times daily as needed.  . cetirizine (ZYRTEC) 10 MG tablet Take 1 tablet (10 mg total) by mouth daily.  . hydrOXYzine (VISTARIL) 50 MG capsule Take 1 capsule (50 mg total) by mouth every 6 (six) hours as needed for anxiety.  . predniSONE (STERAPRED UNI-PAK 21 TAB) 10 MG (21) TBPK tablet As directed x 6 days  . triamcinolone cream (KENALOG) 0.1 % Apply 1 application topically 2 (two) times daily.   No facility-administered encounter medications on file as of 07/24/2019.    No Known Allergies  Review of Systems  Constitutional: Negative for activity change, appetite change, chills, diaphoresis, fatigue, fever and unexpected weight change.  HENT: Negative.  Negative for congestion, rhinorrhea and sore throat.   Eyes: Negative.  Negative for photophobia, pain and visual disturbance.  Respiratory: Negative for cough, chest tightness and shortness of breath.   Cardiovascular: Negative for chest pain, palpitations and leg swelling.  Gastrointestinal: Negative for abdominal pain, anorexia, blood in stool, constipation, diarrhea, nausea and vomiting.  Endocrine: Negative.   Genitourinary: Negative for decreased urine volume, difficulty urinating, dysuria, frequency and urgency.  Musculoskeletal: Negative for arthralgias, joint pain and myalgias.  Skin: Positive for color change and rash. Negative for nail changes, pallor and wound.  Allergic/Immunologic: Negative.   Neurological: Negative for dizziness, syncope, weakness, light-headedness and headaches.  Hematological: Negative.   Psychiatric/Behavioral: Negative for confusion,  hallucinations, sleep disturbance and suicidal ideas.  All other systems reviewed and are negative.        Observations/Objective: No vital signs or physical exam, this was a telephone or virtual health encounter.  Pt alert and oriented, answers all questions appropriately, and able to speak in full sentences.    Assessment and Plan: Dinero was seen today for rash.  Diagnoses and all orders for this visit:  Rash in adult Pruritic rash. Possibly due to new clothing. Symptomatic care discussed in detail. Pt can take benadryl as needed for pruritis. No red flags concerning for anaphylaxis. Will initiate below. Pt aware to report any new, worsening or persistent symptoms. -     predniSONE (STERAPRED UNI-PAK 21 TAB) 10 MG (21) TBPK tablet; As directed x 6 days -     triamcinolone cream (KENALOG) 0.1 %; Apply 1 application topically 2 (two) times daily. -     cetirizine (ZYRTEC) 10 MG tablet; Take 1 tablet (10 mg total) by mouth daily.     Follow Up Instructions: Return if symptoms worsen or fail to improve.    I discussed the assessment and treatment plan with the patient. The patient was provided an opportunity to ask  questions and all were answered. The patient agreed with the plan and demonstrated an understanding of the instructions.   The patient was advised to call back or seek an in-person evaluation if the symptoms worsen or if the condition fails to improve as anticipated.  The above assessment and management plan was discussed with the patient. The patient verbalized understanding of and has agreed to the management plan. Patient is aware to call the clinic if they develop any new symptoms or if symptoms persist or worsen. Patient is aware when to return to the clinic for a follow-up visit. Patient educated on when it is appropriate to go to the emergency department.    I provided 15 minutes of non-face-to-face time during this encounter. The call started at 0805. The call  ended at 0820. The other time was used for coordination of care.    Kari Baars, FNP-C [facility] [address] [place], [place] [address] [phone] 07/24/2019

## 2019-08-03 ENCOUNTER — Encounter: Payer: Self-pay | Admitting: Family

## 2019-08-03 ENCOUNTER — Ambulatory Visit (INDEPENDENT_AMBULATORY_CARE_PROVIDER_SITE_OTHER): Payer: BC Managed Care – PPO | Admitting: Family

## 2019-08-03 DIAGNOSIS — R197 Diarrhea, unspecified: Secondary | ICD-10-CM | POA: Diagnosis not present

## 2019-08-03 DIAGNOSIS — R109 Unspecified abdominal pain: Secondary | ICD-10-CM | POA: Diagnosis not present

## 2019-08-03 NOTE — Progress Notes (Signed)
   Virtual Visit via telephone Note Due to COVID-19 pandemic this visit was conducted virtually. This visit type was conducted due to national recommendations for restrictions regarding the COVID-19 Pandemic (e.g. social distancing, sheltering in place) in an effort to limit this patient's exposure and mitigate transmission in our community. All issues noted in this document were discussed and addressed.  A physical exam was not performed with this format.  I connected with Timothy Ross on 08/03/19 at 3:10 pm by telephone and verified that I am speaking with the correct person using two identifiers. Timothy Ross is currently located at home and no one is currently with him during visit. The provider, Jannifer Rodney, FNP is located in their office at time of visit.  I discussed the limitations, risks, security and privacy concerns of performing an evaluation and management service by telephone and the availability of in person appointments. I also discussed with the patient that there may be a patient responsible charge related to this service. The patient expressed understanding and agreed to proceed.   History and Present Illness:  \Pt calls the office today with chronic abdominal cramping and diarrhea that started over two years ago after cholecystomy. He states he continues to have diarrhea and abdominal cramping after eating no mater what he eats even if it is not a fatty meal. He has never seen a GI provider.   Abdominal Pain Associated symptoms include diarrhea and flatus. Pertinent negatives include no fever, vomiting or weight loss.  Diarrhea  This is a chronic problem. The current episode started more than 1 year ago. The problem occurs 5 to 10 times per day. The problem has been waxing and waning. The stool consistency is described as watery. The patient states that diarrhea does not awaken him from sleep. Associated symptoms include abdominal pain, bloating and increased flatus.  Pertinent negatives include no fever, URI, vomiting or weight loss. Exacerbated by: all food. Risk factors include recent antibiotic use. He has tried increased fluids, change of diet and bismuth subsalicylate for the symptoms. The treatment provided mild relief.     Review of Systems  Constitutional: Negative for fever and weight loss.  Gastrointestinal: Positive for abdominal pain, bloating, diarrhea and flatus. Negative for vomiting.     Observations/Objective: No SOB or distress noted  Assessment and Plan: 1. Diarrhea, unspecified type - Ambulatory referral to Gastroenterology  2. Abdominal cramping - Ambulatory referral to Gastroenterology  PT will take Imodium as needed Star fiber every day  Avoid high fat meals Referral to GI pending  I discussed the assessment and treatment plan with the patient. The patient was provided an opportunity to ask questions and all were answered. The patient agreed with the plan and demonstrated an understanding of the instructions.   The patient was advised to call back or seek an in-person evaluation if the symptoms worsen or if the condition fails to improve as anticipated.  The above assessment and management plan was discussed with the patient. The patient verbalized understanding of and has agreed to the management plan. Patient is aware to call the clinic if symptoms persist or worsen. Patient is aware when to return to the clinic for a follow-up visit. Patient educated on when it is appropriate to go to the emergency department.   Time call ended:  3:30 pm  I provided 20 minutes of non-face-to-face time during this encounter.    Jannifer Rodney, FNP

## 2019-08-05 ENCOUNTER — Encounter: Payer: Self-pay | Admitting: Internal Medicine

## 2019-09-02 ENCOUNTER — Other Ambulatory Visit: Payer: Self-pay

## 2019-09-02 ENCOUNTER — Encounter: Payer: Self-pay | Admitting: Nurse Practitioner

## 2019-09-02 ENCOUNTER — Ambulatory Visit: Payer: BC Managed Care – PPO | Admitting: Nurse Practitioner

## 2019-09-02 DIAGNOSIS — K909 Intestinal malabsorption, unspecified: Secondary | ICD-10-CM | POA: Diagnosis not present

## 2019-09-02 DIAGNOSIS — R197 Diarrhea, unspecified: Secondary | ICD-10-CM | POA: Diagnosis not present

## 2019-09-02 MED ORDER — CHOLESTYRAMINE 4 G PO PACK: 4.0000 g | PACK | Freq: Three times a day (TID) | ORAL | 3 refills | Status: DC

## 2019-09-02 NOTE — Patient Instructions (Signed)
Your health issues we discussed today were:   Diarrhea: 1. You can obtain a stool collection kit from the lab 2. Collect your stool sample (must be liquid/diarrhea) and bring the sample back to the lab 3. We will call you when we get results 4. In the meantime I have sent a prescription to your pharmacy for cholestyramine 4 g.  Take this 3 times a day, with meals. 5. Call us in 2 to 4 weeks and let us know if it is helping your diarrhea at all 6. Continue to avoid fried, greasy, high-fat foods which should help improve your symptoms as well 7. Call us if you have any worsening or severe symptoms  Overall I recommend:  1. Return for follow-up in 2 months 2. Call us if you have any questions or concerns   ---------------------------------------------------------------  COVID-19 Vaccine Information can be found at: ShippingScam.co.uk For questions related to vaccine distribution or appointments, please email [email or web address] or call [phone].   ---------------------------------------------------------------  At [facility] we value your feedback. You may receive a survey about your visit today. Please share your experience as we strive to create trusting relationships with our patients to provide genuine, compassionate, quality care.  We appreciate your understanding and patience as we review any laboratory studies, imaging, and other diagnostic tests that are ordered as we care for you. Our office policy is 5 business days for review of these results, and any emergent or urgent results are addressed in a timely manner for your best interest. If you do not hear from our office in 1 week, please contact us.   We also encourage the use of MyChart, which contains your medical information for your review as well. If you are not enrolled in this feature, an access code is on this after visit summary for your  convenience. Thank you for allowing [place] to be involved in your care.  It was great to see you today!  I hope you have a great day!!

## 2019-09-02 NOTE — Progress Notes (Signed)
Primary Care Physician:  Junie Spencer, FNP Primary Gastroenterologist:  Dr. Jena Gauss  Chief Complaint  Patient presents with  . Diarrhea    Food going straight through him, oily bm's    HPI:   Timothy Ross is a 29 y.o. male who presents on referral from primary care for diarrhea.  Reviewed information provided with referral including office visit dated 08/03/2019 for of diarrhea and abdominal cramping.  This is a virtual office visit due to COVID-19/coronavirus pandemic.  At that time he noted chronic abdominal cramping and diarrhea over 2 years ago after cholecystectomy.  Continues to have diarrhea and abdominal cramping after eating no matter what he eats even if it is not a fatty meal.  Has never been evaluated by GI.  He is having 5-10 stools a day, watery stools.  No nocturnal stools.  Also notes bloating and increased flatus.  He has had recent antibiotic use.  He is change his diet and is using Pepto-Bismol and increase fluids to help.  No fever, vomiting, weight loss.  At that time they recommended Imodium, fiber daily, avoid high fat meals, refer to GI.  No history of colonoscopy or endoscopy in our system.  Today he states he's doing ok overall. Was told after cholecystectomy that his body would likely adapt, but thus far hasn't happened. Has a bowel movement about every 5-7 times day. In the past couple weeks he's eliminated greasy foods, eating more fruit and fiber foods and he has improved somewhat with 2-3 stools a day. Generally has loose stool postprandially, sometimes before he's even done eating. He gets a "stomach ache" with these episodes, but not overt pain or cramping. He notes his stools appear yellow-ish and oily, which seems to have improved with his dietary changes. Hasn't really tried Imodium. Denies hematochezia, melena, fever, chills, unintentional weight loss. Denies recent sick contacts. No recent antibiotics. Denies URI or flu-like symptoms. Denies loss of sense  of taste or smell. Was tested for COVID-19 about a month ago and was negative. He is unsure about vaccination but seems interested in more information. Denies chest pain, dyspnea, dizziness, lightheadedness, syncope, near syncope. Denies any other upper or lower GI symptoms.  Past Medical History:  Diagnosis Date  . GERD (gastroesophageal reflux disease)   . History of cardiovascular stress test 2014   normal stress echo    Past Surgical History:  Procedure Laterality Date  . CHOLECYSTECTOMY N/A 01/02/2018   Procedure: LAPAROSCOPIC CHOLECYSTECTOMY;  Surgeon: Franky Macho, MD;  Location: [facility];  Service: General;  Laterality: N/A;    No current outpatient medications on file.   No current facility-administered medications for this visit.    Allergies as of 09/02/2019  . (No Known Allergies)    Family History  Problem Relation Age of Onset  . Hypertension Mother   . Diabetes Maternal Grandmother   . Colon cancer Neg Hx   . Gastric cancer Neg Hx   . Esophageal cancer Neg Hx     Social History   Socioeconomic History  . Marital status: Single    Spouse name: Not on file  . Number of children: Not on file  . Years of education: Not on file  . Highest education level: Not on file  Occupational History  . Not on file  Tobacco Use  . Smoking status: Never Smoker  . Smokeless tobacco: Never Used  Substance and Sexual Activity  . Alcohol use: No    Comment: occasionally  . Drug  use: No  . Sexual activity: Yes  Other Topics Concern  . Not on file  Social History Narrative  . Not on file   Social Determinants of Health   Financial Resource Strain:   . Difficulty of Paying Living Expenses:   Food Insecurity:   . Worried About Charity fundraiser in the Last Year:   . Arboriculturist in the Last Year:   Transportation Needs:   . Film/video editor (Medical):   Mar[place] Kitchen Lack of Transportation (Non-Medical):   Physical Activity:   . Days of Exercise per Week:   .  Minutes of Exercise per Session:   Stress:   . Feeling of Stress :   Social Connections:   . Frequency of Communication with Friends and Family:   . Frequency of Social Gatherings with Friends and Family:   . Attends Religious Services:   . Active Member of Clubs or Organizations:   . Attends Archivist Meetings:   Marland Kitchen Marital Status:   Intimate Partner Violence:   . Fear of Current or Ex-Partner:   . Emotionally Abused:   Mar[place] Kitchen Physically Abused:   . Sexually Abused:     Review of Systems: General: Negative for anorexia, weight loss, fever, chills, fatigue, weakness. ENT: Negative for hoarseness, difficulty swallowing. CV: Negative for chest pain, angina, palpitations, peripheral edema.  Respiratory: Negative for dyspnea at rest, cough, sputum, wheezing.  GI: See history of present illness.  MS: Negative for joint pain, low back pain.  Derm: Negative for rash or itching.  Endo: Negative for unusual weight change.  Heme: Negative for bruising or bleeding. Allergy: Negative for rash or hives.    Physical Exam: BP (!) 147/101   Pulse 90   Temp (!) 96.9 F (36.1 C) (Temporal)   Ht 5\' 9"  (1.753 m)   Wt 167 lb 12.8 oz (76.1 kg)   BMI 24.78 kg/m  General:   Alert and oriented. Pleasant and cooperative. Well-nourished and well-developed.  Head:  Normocephalic and atraumatic. Eyes:  Without icterus, sclera clear and conjunctiva pink.  Ears:  Normal auditory acuity. Cardiovascular:  S1, S2 present without murmurs appreciated. Extremities without clubbing or edema. Respiratory:  Clear to auscultation bilaterally. No wheezes, rales, or rhonchi. No distress.  Gastrointestinal:  +BS, soft, non-tender and non-distended. No HSM noted. No guarding or rebound. No masses appreciated.  Rectal:  Deferred  Musculoskalatal:  Symmetrical without gross deformities. Neurologic:  Alert and oriented x4;  grossly normal neurologically. Psych:  Alert and cooperative. Normal mood and  affect. Heme/Lymph/Immune: No excessive bruising noted.    09/02/2019 2:23 PM   Disclaimer: This note was dictated with voice recognition software. Similar sounding words can inadvertently be transcribed and may not be corrected upon review.

## 2019-09-02 NOTE — Assessment & Plan Note (Addendum)
The patient presents today with complaints of diarrhea.  This is been ongoing for about 2 years since he had a cholecystectomy.  He was expecting some improvement but this never really came to fruition.  Initially his primary care visit indicated abdominal cramping with today he states he just gets "upset stomach" not really abdominal pain or cramping.  Interestingly, he has made dietary changes to eliminate fried/oily/greasy foods which he feels has made somewhat of an improvement, although he is still having 2-3 liquid stools a day typically immediately postprandially.  There is a question of possible recent antibiotics, although the patient cannot think of any (primary care note indicated he had).  I will check stool studies to ensure there is no superimposed infection.  I also check pancreatic fecal elastase due to his descriptions of the stool as "oily".  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  Depending on his results and his response to cholestyramine we can make further recommendations at his follow-up visit in 2 months.  I have asked him to call us in 2 to 4 weeks and let us know if the cholestyramine is helping.  Follow-up in 2 months.

## 2019-09-03 NOTE — Progress Notes (Signed)
Cc'ed to pcp °

## 2019-09-07 DIAGNOSIS — K909 Intestinal malabsorption, unspecified: Secondary | ICD-10-CM | POA: Diagnosis not present

## 2019-09-07 DIAGNOSIS — R197 Diarrhea, unspecified: Secondary | ICD-10-CM | POA: Diagnosis not present

## 2019-09-14 LAB — GASTROINTESTINAL PATHOGEN PANEL PCR
C. difficile Tox A/B, PCR: NOT DETECTED
Campylobacter, PCR: NOT DETECTED
Cryptosporidium, PCR: NOT DETECTED
E coli (ETEC) LT/ST PCR: NOT DETECTED
E coli (STEC) stx1/stx2, PCR: NOT DETECTED
E coli 0157, PCR: NOT DETECTED
Giardia lamblia, PCR: NOT DETECTED
Norovirus, PCR: NOT DETECTED
Rotavirus A, PCR: NOT DETECTED
Salmonella, PCR: NOT DETECTED
Shigella, PCR: NOT DETECTED

## 2019-09-14 LAB — C. DIFFICILE GDH AND TOXIN A/B
GDH ANTIGEN: NOT DETECTED
MICRO NUMBER:: 10278471
SPECIMEN QUALITY:: ADEQUATE
TOXIN A AND B: NOT DETECTED

## 2019-09-14 LAB — PANCREATIC ELASTASE, FECAL: Pancreatic Elastase-1, Stool: >500 mcg/g

## 2019-10-26 ENCOUNTER — Telehealth: Payer: Self-pay | Admitting: *Deleted

## 2019-10-26 NOTE — Telephone Encounter (Signed)
Pt consented to a virtual visit. 

## 2019-10-26 NOTE — Telephone Encounter (Signed)
Timothy Ross, you are scheduled for a virtual visit with your provider today.  Just as we do with appointments in the office, we must obtain your consent to participate.  Your consent will be active for this visit and any virtual visit you may have with one of our providers in the next 365 days.  If you have a MyChart account, I can also send a copy of this consent to you electronically.  All virtual visits are billed to your insurance company just like a traditional visit in the office.  As this is a virtual visit, video technology does not allow for your provider to perform a traditional examination.  This may limit your provider's ability to fully assess your condition.  If your provider identifies any concerns that need to be evaluated in person or the need to arrange testing such as labs, EKG, etc, we will make arrangements to do so.  Although advances in technology are sophisticated, we cannot ensure that it will always work on either your end or our end.  If the connection with a video visit is poor, we may have to switch to a telephone visit.  With either a video or telephone visit, we are not always able to ensure that we have a secure connection.   I need to obtain your verbal consent now.   Are you willing to proceed with your visit today?

## 2019-10-27 ENCOUNTER — Other Ambulatory Visit: Payer: Self-pay

## 2019-10-27 ENCOUNTER — Telehealth (INDEPENDENT_AMBULATORY_CARE_PROVIDER_SITE_OTHER): Payer: BC Managed Care – PPO | Admitting: Nurse Practitioner

## 2019-10-27 ENCOUNTER — Encounter: Payer: Self-pay | Admitting: Nurse Practitioner

## 2019-10-27 ENCOUNTER — Encounter: Payer: Self-pay | Admitting: Internal Medicine

## 2019-10-27 DIAGNOSIS — R197 Diarrhea, unspecified: Secondary | ICD-10-CM

## 2019-10-27 DIAGNOSIS — K909 Intestinal malabsorption, unspecified: Secondary | ICD-10-CM | POA: Diagnosis not present

## 2019-10-27 DIAGNOSIS — R109 Unspecified abdominal pain: Secondary | ICD-10-CM | POA: Insufficient documentation

## 2019-10-27 NOTE — Patient Instructions (Signed)
Your health issues we discussed today were:   Abdominal pain: 1. I am glad your pain has resolved 2. Call us if you have any worsening or recurrent pain  Diarrhea: 1. I am sorry your insurance company would not cover the medication 2. In the meantime, continue eating a lot of fiber foods such as fruits as well as smaller, more frequent meals 3. Try taking Metamucil or Benefiber (over-the-counter fiber supplement) 1-2 times a day 4. Hopefully this will be more doable while working 5. Call us if you have any worsening diarrhea or if the fiber supplements do not help 6. If no improvement, we can contact your insurance company and see what similar medications to cholestyramine (Questran) they may cover  Overall I recommend:  1. Continue your other current medications 2. Return for follow-up in 3 months 3. Call us if you have any questions or concerns.   ---------------------------------------------------------------  COVID-19 Vaccine Information can be found at: [email or web address] For questions related to vaccine distribution or appointments, please email [email or web address] or call [phone].   ---------------------------------------------------------------   At [facility] we value your feedback. You may receive a survey about your visit today. Please share your experience as we strive to create trusting relationships with our patients to provide genuine, compassionate, quality care.  We appreciate your understanding and patience as we review any laboratory studies, imaging, and other diagnostic tests that are ordered as we care for you. Our office policy is 5 business days for review of these results, and any emergent or urgent results are addressed in a timely manner for your best interest. If you do not hear from our office in 1 week, please contact us.   We also encourage the use of MyChart, which  contains your medical information for your review as well. If you are not enrolled in this feature, an access code is on this after visit summary for your convenience. Thank you for allowing [place] to be involved in your care.  It was great to see you today!  I hope you have a great Summer!!

## 2019-10-27 NOTE — Assessment & Plan Note (Signed)
Abdominal pain essentially resolved at this time.  Continue current dietary changes and follow-up in 3 months.

## 2019-10-27 NOTE — Progress Notes (Signed)
Referring Provider: Sharion Balloon, FNP Primary Care Physician:  Sharion Balloon, FNP Primary GI:  Dr. Gala Romney  NOTE: Service was provided via telemedicine and was requested by the patient due to COVID-19 pandemic.  Patient Location: Home  Provider Location: [place] office  Reason for Phone Visit: Follow-up  Persons present on the phone encounter, with roles: Patient, myself (provider),Mindy Estudillo (updated meds and allergies)  Total time (minutes) spent on medical discussion: 23 minues  Due to COVID-19, visit was conducted using telephonic method (no video was available).  Visit was requested by patient.  Virtual Visit via Telephone only  I connected with Timothy Ross on 10/27/19 at  8:30 AM EDT by Video Visit and verified that I am speaking with the correct person using two identifiers.   I discussed the limitations, risks, security and privacy concerns of performing an evaluation and management service by telephone and the availability of in person appointments. I also discussed with the patient that there may be a patient responsible charge related to this service. The patient expressed understanding and agreed to proceed.  Chief Complaint  Patient presents with  . Diarrhea    f/u. off/on about once a month. Did not start [place] as insurance did not cover.     HPI:   Timothy Ross is a 29 y.o. male who presents for virtual visit regarding: diarrhea.  The patient was last seen in our office 09/02/2019 for diarrhea due to malabsorption.  His last visit was a new patient visit from primary care for diarrhea and abdominal cramping.  It was also a virtual office visit due to COVID-19/coronavirus pandemic.  Noted chronic abdominal pain, diarrhea over 2 years ago after cholecystectomy with persistent symptoms after eating no matter what he eats, even if not a fatty meal.  5-10 stools a day, watery.  No nocturnal stools.  Noted bloating and increased gas.  Noted recent antibiotic  use.  Has made dietary changes, increased fluids, Pepto-Bismol to help.  At his last visit he noted that he was told after his cholecystectomy his body would likely adapt but this has not happened and had a bowel movement about 5-7 times a day.  He is eliminated greasy foods in the past couple weeks as well as increasing fruit and fiber foods with some improvement to 2-3 stools a day.  Generally has loose stool postprandially, sometimes even before eating with a "stomachache" associated but no overt pain or cramping.  Stools appear yellowish and oily.  Has not tried Imodium.  No other overt GI complaints.  Recommended stool studies, cholestyramine 4 g 3 times a day with meals, progress report in 2 to 4 weeks, avoid fried, greasy, high-fat foods. Follow-up in 2 months.  Stool studies completed 09/07/2019 which was negative for C. difficile, normal GI pathogen panel.  Pancreatic fecal elastase was normal as well reducing the likelihood of exocrine pancreatic insufficiency.  Today he states he's doing ok overall. Insurance would not cover Questran, would cost $250. His diarrhea is doing a bit better. Currently having 3-4 times a day currently (when eating a lot of fiber, was having "normal" daily stools). He has been trying different ways to eat. For 2-3 weeks, tried a lot more fruit which helped regulate his stool character and frequency. He stopped this recently, and stools went back to loose. Eating correctly (smaller, more frequent meals) helps but is difficult with his work. He tried Imodium for a few days which didn't result in a difference. Denies  abdominal pain at this time (essentially resolved, except dietary indiscretions). Denies hematochezia, melena, fever, chills, unintentional weight loss. Denies URI or flu-like symptoms. Denies loss of sense of taste or smell. The patient has not received COVID-19 vaccination(s). They are not interested in more information at this time. Denies chest pain, dyspnea,  dizziness, lightheadedness, syncope, near syncope. Denies any other upper or lower GI symptoms.   Past Medical History:  Diagnosis Date  . GERD (gastroesophageal reflux disease)   . History of cardiovascular stress test 2014   normal stress echo    Past Surgical History:  Procedure Laterality Date  . CHOLECYSTECTOMY N/A 01/02/2018   Procedure: LAPAROSCOPIC CHOLECYSTECTOMY;  Surgeon: Franky Macho, MD;  Location: [facility];  Service: General;  Laterality: N/A;    No current outpatient medications on file.   No current facility-administered medications for this visit.    Allergies as of 10/27/2019  . (No Known Allergies)    Family History  Problem Relation Age of Onset  . Hypertension Mother   . Diabetes Maternal Grandmother   . Colon cancer Neg Hx   . Gastric cancer Neg Hx   . Esophageal cancer Neg Hx     Social History   Socioeconomic History  . Marital status: Single    Spouse name: Not on file  . Number of children: Not on file  . Years of education: Not on file  . Highest education level: Not on file  Occupational History  . Not on file  Tobacco Use  . Smoking status: Never Smoker  . Smokeless tobacco: Never Used  Substance and Sexual Activity  . Alcohol use: Yes    Comment: occasionally  . Drug use: No  . Sexual activity: Yes  Other Topics Concern  . Not on file  Social History Narrative  . Not on file   Social Determinants of Health   Financial Resource Strain:   . Difficulty of Paying Living Expenses:   Food Insecurity:   . Worried About Programme researcher, broadcasting/film/video in the Last Year:   . Barista in the Last Year:   Transportation Needs:   . Freight forwarder (Medical):   Marland Kitchen Lack of Transportation (Non-Medical):   Physical Activity:   . Days of Exercise per Week:   . Minutes of Exercise per Session:   Stress:   . Feeling of Stress :   Social Connections:   . Frequency of Communication with Friends and Family:   . Frequency of Social  Gatherings with Friends and Family:   . Attends Religious Services:   . Active Member of Clubs or Organizations:   . Attends Banker Meetings:   Marland Kitchen Marital Status:     Review of Systems: Review of Systems  Constitutional: Negative for chills, fever, malaise/fatigue and weight loss.  HENT: Negative for congestion and sore throat.   Respiratory: Negative for cough and shortness of breath.   Cardiovascular: Negative for chest pain and palpitations.  Gastrointestinal: Negative for abdominal pain, blood in stool, diarrhea, melena, nausea and vomiting.  Musculoskeletal: Negative for joint pain and myalgias.  Skin: Negative for rash.  Neurological: Negative for dizziness and weakness.  Endo/Heme/Allergies: Does not bruise/bleed easily.  Psychiatric/Behavioral: Negative for depression. The patient is not nervous/anxious.   All other systems reviewed and are negative.   Physical Exam: Note: limited exam due to virtual visit There were no vitals taken for this visit. Physical Exam Vitals and nursing note reviewed.  Constitutional:  General: He is not in acute distress.    Appearance: Normal appearance. He is not ill-appearing, toxic-appearing or diaphoretic.  HENT:     Head: Normocephalic and atraumatic.     Nose: No congestion or rhinorrhea.  Eyes:     General: No scleral icterus. Pulmonary:     Effort: Pulmonary effort is normal.  Abdominal:     General: There is no distension.     Palpations: There is no hepatomegaly or splenomegaly.  Skin:    Coloration: Skin is not jaundiced.     Findings: No bruising (none obvious on video).  Neurological:     General: No focal deficit present.     Mental Status: He is alert and oriented to person, place, and time. Mental status is at baseline.  Psychiatric:        Mood and Affect: Mood normal.        Behavior: Behavior normal.        Thought Content: Thought content normal.

## 2019-10-27 NOTE — Assessment & Plan Note (Signed)
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  Unfortunately his insurance did not pay for cholestyramine.  He did not notify us to allow [place] to contact insurance for coverage options.  He has had some improvement with his diarrhea with increased fiber/fluid intake.  However, due to work he is not always able to eat smaller, more frequent meals and/or increased fiber while working and this results in some worsening of his stools intermittently.  Overall, his symptoms have not resolved.  In the meantime, due to cost issues, we will trial him on fiber supplements with Metamucil or Benefiber to see if this has the same effect as his increased dietary fiber.  This is something that would likely be more doable at work.  I have asked him to call us for any worsening diarrhea, or if the fiber supplement does not help.  If we have coupons I will send some to him.  Follow-up in 3 months.  If his symptoms do not improve, we can contact his insurance to check on bile acid sequestrants that are covered.

## 2020-01-25 ENCOUNTER — Encounter: Payer: Self-pay | Admitting: Nurse Practitioner

## 2020-01-25 ENCOUNTER — Ambulatory Visit (INDEPENDENT_AMBULATORY_CARE_PROVIDER_SITE_OTHER): Payer: BC Managed Care – PPO | Admitting: Nurse Practitioner

## 2020-01-25 ENCOUNTER — Other Ambulatory Visit: Payer: Self-pay

## 2020-01-25 VITALS — BP 119/81 | HR 72 | Temp 97.3°F | Resp 20 | Ht 69.0 in | Wt 165.0 lb

## 2020-01-25 DIAGNOSIS — R591 Generalized enlarged lymph nodes: Secondary | ICD-10-CM

## 2020-01-25 NOTE — Patient Instructions (Signed)

## 2020-01-25 NOTE — Assessment & Plan Note (Signed)
Patient is a 29 year old male who presents to clinic with lymphadenopathy.  Patient reports swollen lymph node present in the last 6 weeks under his left and right armpits.  Around his breast area swollen with red nipples and tenderness.  Patient is reporting the lymph nodes not swollen today but reports getting mardena COVID-19 vaccination shots 01/10/2020.  Lymph nodes were present before vaccination.  Patient reports lymph nodes did not get worse or better with vaccination.  And does not believe lymph node is related to vaccine.  Patient has no fever, chills, nausea or vomiting. Provided education to patient with written handouts given. Labs collected today CBC, HIV with reflex. We will treat appropriately based on lab results.

## 2020-01-25 NOTE — Progress Notes (Signed)
Acute Office Visit  Subjective:    Patient ID: Timothy Ross, male    DOB: 1990-09-27, 29 y.o.   MRN: [id number]  Chief Complaint  Patient presents with   swollen lymph nodes recently    axilla and breast area     HPI Patient is in today for lymphadenopathy.  Patient reports swollen lymph nodes present in the last 6 weeks under his left and right armpits, around breast area, with red nipples and tenderness.  Patient is reporting that lymph nodes are not swollen today.  He reports getting mardena COVID-19 vaccine shot 01/10/2020 but did report he had swollen lymph nodes prior to getting vaccination.  Patient has no fever, chills, nausea, and vomiting.  Past Medical History:  Diagnosis Date   GERD (gastroesophageal reflux disease)    History of cardiovascular stress test 2014   normal stress echo    Past Surgical History:  Procedure Laterality Date   CHOLECYSTECTOMY N/A 01/02/2018   Procedure: LAPAROSCOPIC CHOLECYSTECTOMY;  Surgeon: Franky Macho, MD;  Location: [facility];  Service: General;  Laterality: N/A;    Family History  Problem Relation Age of Onset   Hypertension Mother    Diabetes Maternal Grandmother    Colon cancer Neg Hx    Gastric cancer Neg Hx    Esophageal cancer Neg Hx     Social History   Socioeconomic History   Marital status: Single    Spouse name: Not on file   Number of children: Not on file   Years of education: Not on file   Highest education level: Not on file  Occupational History   Not on file  Tobacco Use   Smoking status: Never Smoker   Smokeless tobacco: Never Used  Vaping Use   Vaping Use: Never used  Substance and Sexual Activity   Alcohol use: Yes    Comment: occasionally   Drug use: No   Sexual activity: Yes  Other Topics Concern   Not on file  Social History Narrative   Not on file   Social Determinants of Health   Financial Resource Strain:    Difficulty of Paying Living Expenses:   Food  Insecurity:    Worried About Programme researcher, broadcasting/film/video in the Last Year:    Barista in the Last Year:   Transportation Needs:    Freight forwarder (Medical):    Lack of Transportation (Non-Medical):   Physical Activity:    Days of Exercise per Week:    Minutes of Exercise per Session:   Stress:    Feeling of Stress :   Social Connections:    Frequency of Communication with Friends and Family:    Frequency of Social Gatherings with Friends and Family:    Attends Religious Services:    Active Member of Clubs or Organizations:    Attends Engineer, structural:    Marital Status:   Intimate Partner Violence:    Fear of Current or Ex-Partner:    Emotionally Abused:    Physically Abused:    Sexually Abused:        Review of Systems  Constitutional: Negative.   HENT: Negative.   Eyes: Negative.   Respiratory: Negative.   Cardiovascular: Negative.   Gastrointestinal: Negative.   Genitourinary: Negative.   Musculoskeletal: Negative.   Skin: Positive for color change.       Redness around nipple  Neurological: Negative for light-headedness and headaches.  Hematological: Positive for adenopathy.  Objective:    Physical Exam Constitutional:      Appearance: Normal appearance.  HENT:     Head: Normocephalic.  Eyes:     Conjunctiva/sclera: Conjunctivae normal.  Cardiovascular:     Rate and Rhythm: Normal rate.     Pulses: Normal pulses.     Heart sounds: Normal heart sounds.  Pulmonary:     Effort: Pulmonary effort is normal.     Breath sounds: Normal breath sounds.  Abdominal:     General: Bowel sounds are normal.  Musculoskeletal:        General: Normal range of motion.  Skin:    General: Skin is warm.     Findings: Erythema present.  Neurological:     Mental Status: He is alert and oriented to person, place, and time.  Psychiatric:        Mood and Affect: Mood normal.        Behavior: Behavior normal.     BP 119/81     Pulse 72    Temp (!) 97.3 F (36.3 C)    Resp 20    Ht 5\' 9"  (1.753 m)    Wt 165 lb (74.8 kg)    SpO2 96%    BMI 24.37 kg/m  Wt Readings from Last 3 Encounters:  01/25/20 165 lb (74.8 kg)  09/02/19 167 lb 12.8 oz (76.1 kg)  08/15/18 159 lb 3.2 oz (72.2 kg)    Health Maintenance Due  Topic Date Due   Hepatitis C Screening  Never done   HIV Screening  Never done   TETANUS/TDAP  04/06/2017   INFLUENZA VACCINE  01/17/2020      Lab Results  Component Value Date   TSH 1.240 03/04/2017   Lab Results  Component Value Date   WBC 9.5 12/09/2017   HGB 15.7 12/09/2017   HCT 45.8 12/09/2017   MCV 90.2 12/09/2017   PLT 296 12/09/2017   Lab Results  Component Value Date   NA 138 12/09/2017   K 3.5 12/09/2017   CO2 26 12/09/2017   GLUCOSE 111 (H) 12/09/2017   BUN 19 12/09/2017   CREATININE 1.03 12/09/2017   BILITOT 0.7 12/09/2017   ALKPHOS 43 12/09/2017   AST 32 12/09/2017   ALT 71 (H) 12/09/2017   PROT 8.0 12/09/2017   ALBUMIN 4.8 12/09/2017   CALCIUM 9.5 12/09/2017   ANIONGAP 11 12/09/2017   Lab Results  Component Value Date   CHOL 241 (H) 12/17/2016   Lab Results  Component Value Date   HDL 47 12/17/2016   Lab Results  Component Value Date   LDLCALC 159 (H) 12/17/2016   Lab Results  Component Value Date   TRIG 175 (H) 12/17/2016   Lab Results  Component Value Date   CHOLHDL 5.1 (H) 12/17/2016       Assessment & Plan:  Lymphadenopathy Patient is a 29 year old male who presents to clinic with lymphadenopathy.  Patient reports swollen lymph node present in the last 6 weeks under his left and right armpits.  Around his breast area swollen with red nipples and tenderness.  Patient is reporting the lymph nodes not swollen today but reports getting mardena COVID-19 vaccination shots 01/10/2020.  Lymph nodes were present before vaccination.  Patient reports lymph nodes did not get worse or better with vaccination.  And does not believe lymph node is related to  vaccine.  Patient has no fever, chills, nausea or vomiting. Provided education to patient with written handouts given. Labs collected today CBC,  HIV with reflex. We will treat appropriately based on lab results.  Problem List Items Addressed This Visit      Immune and Lymphatic   Lymphadenopathy - Primary          Daryll Drown, NP

## 2020-01-26 LAB — CBC WITH DIFFERENTIAL/PLATELET
Basophils Absolute: 0.1 10*3/uL (ref 0.0–0.2)
Basos: 1 %
EOS (ABSOLUTE): 0.2 10*3/uL (ref 0.0–0.4)
Eos: 2 %
Hematocrit: 46.2 % (ref 37.5–51.0)
Hemoglobin: 16 g/dL (ref 13.0–17.7)
Immature Grans (Abs): 0 10*3/uL (ref 0.0–0.1)
Immature Granulocytes: 0 %
Lymphocytes Absolute: 2.8 10*3/uL (ref 0.7–3.1)
Lymphs: 37 %
MCH: 30.8 pg (ref 26.6–33.0)
MCHC: 34.6 g/dL (ref 31.5–35.7)
MCV: 89 fL (ref 79–97)
Monocytes Absolute: 0.7 10*3/uL (ref 0.1–0.9)
Monocytes: 9 %
Neutrophils Absolute: 4 10*3/uL (ref 1.4–7.0)
Neutrophils: 51 %
Platelets: 303 10*3/uL (ref 150–450)
RBC: 5.2 x10E6/uL (ref 4.14–5.80)
RDW: 13.3 % (ref 11.6–15.4)
WBC: 7.7 10*3/uL (ref 3.4–10.8)

## 2020-01-26 LAB — HIV ANTIBODY (ROUTINE TESTING W REFLEX): HIV Screen 4th Generation wRfx: NONREACTIVE

## 2020-01-27 ENCOUNTER — Ambulatory Visit: Payer: BC Managed Care – PPO | Admitting: Nurse Practitioner

## 2020-02-02 ENCOUNTER — Telehealth: Payer: Self-pay | Admitting: Family

## 2020-02-02 NOTE — Telephone Encounter (Signed)
Aware of results and recommendations  

## 2020-05-29 IMAGING — US US ABDOMEN COMPLETE
1 series · 13 of 25 positions shown · non-contrast
Comparison: None.

CLINICAL DATA: Upper abdominal pain, nausea and vomiting.

EXAM:
ABDOMEN ULTRASOUND COMPLETE

[Series 1: us abdomen complete · 0.22mm/px · 13 of 139 slices shown]
[im 1/139]
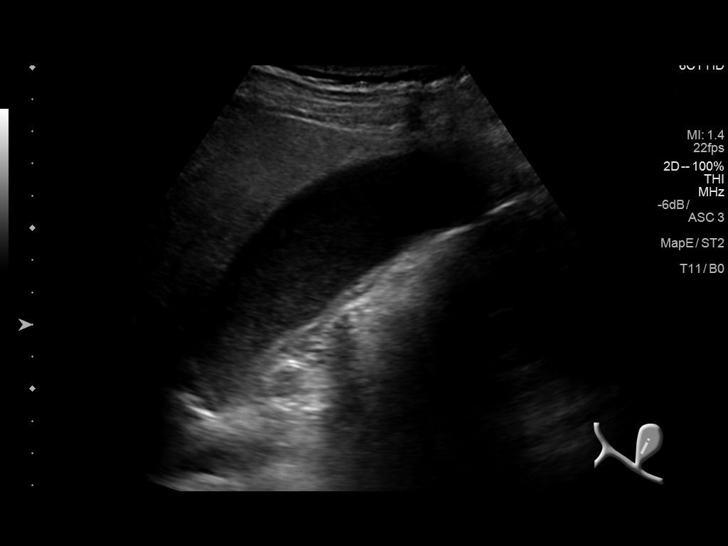
[im 12/139]
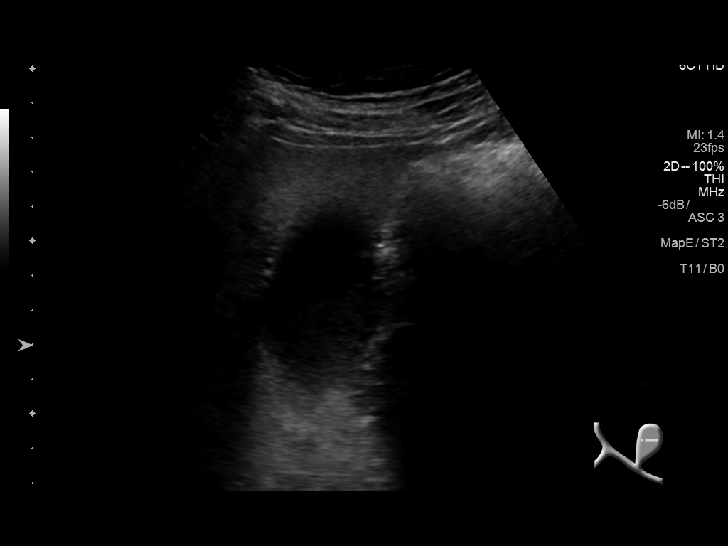
[im 24/139]
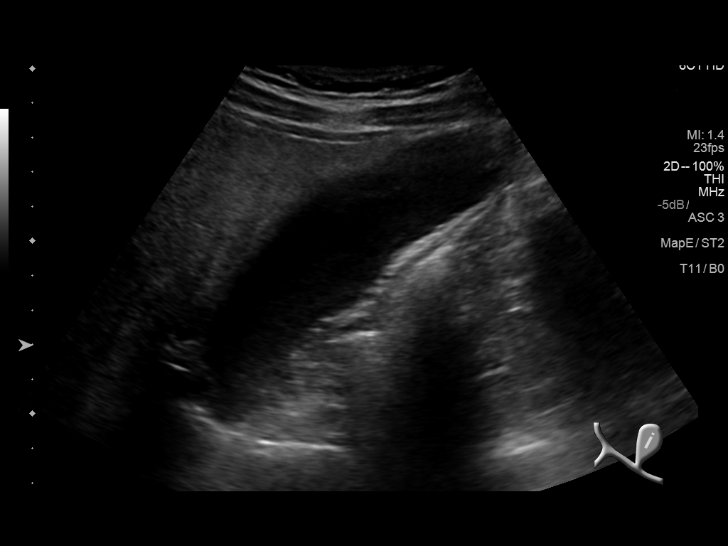
[im 35/139]
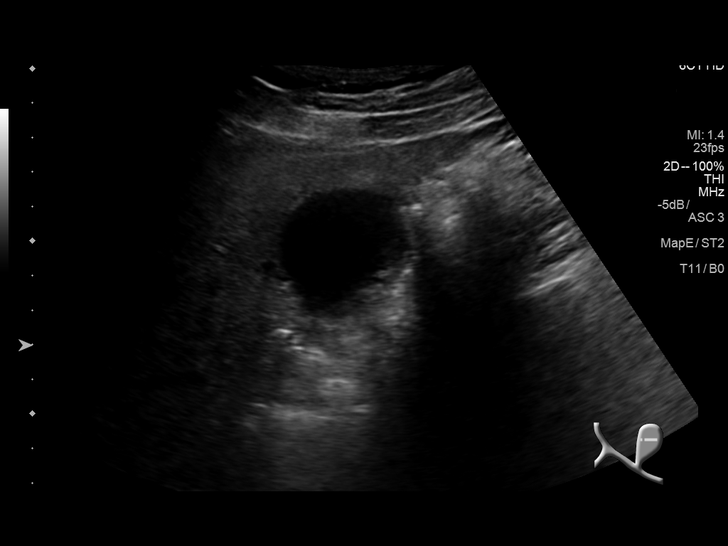
[im 47/139]
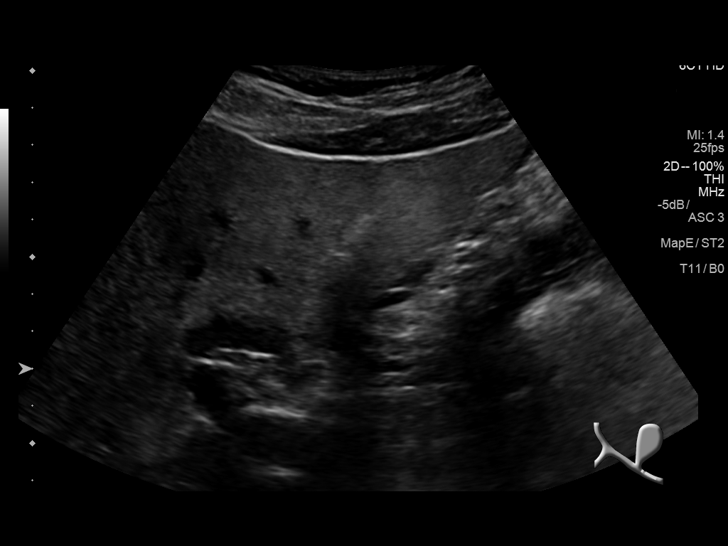
[im 58/139]
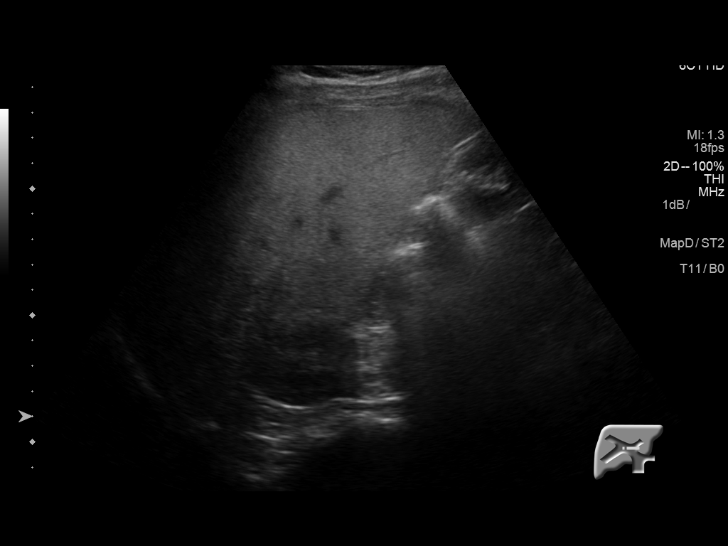
[im 70/139]
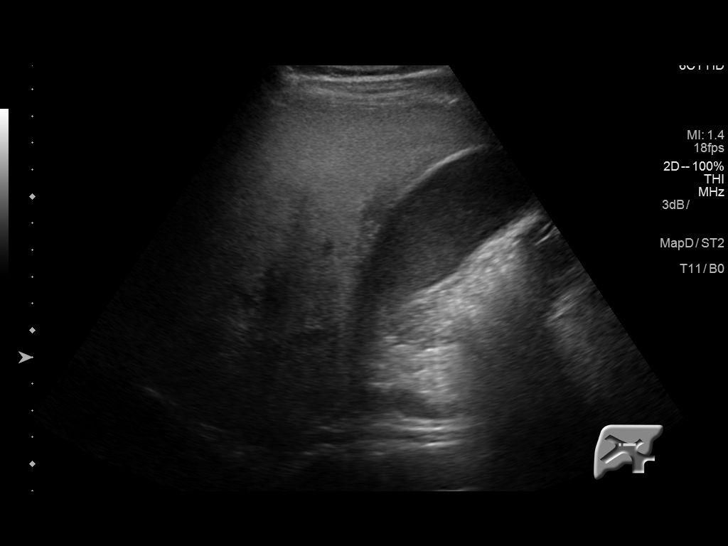
[im 81/139]
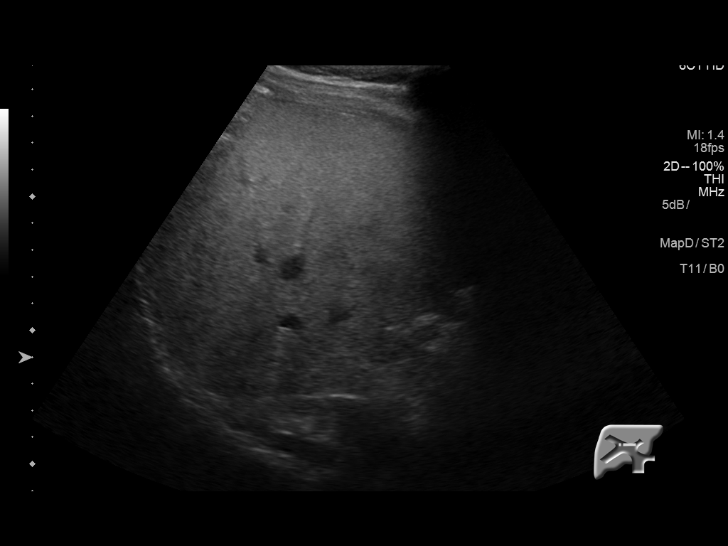
[im 93/139]
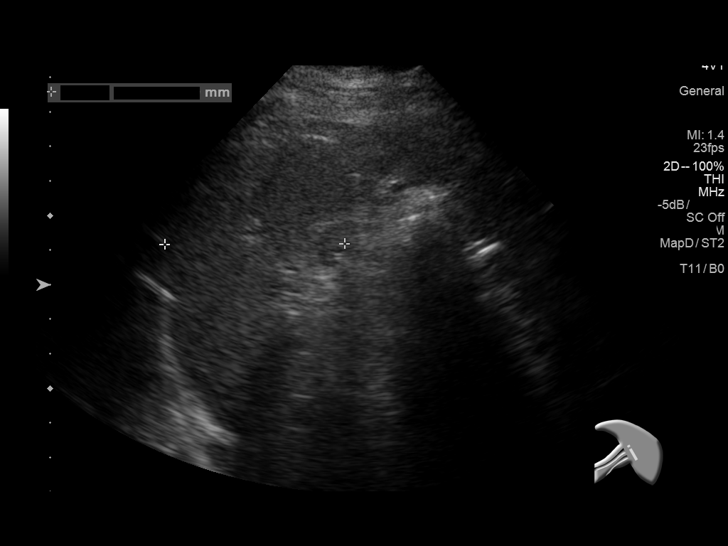
[im 104/139]
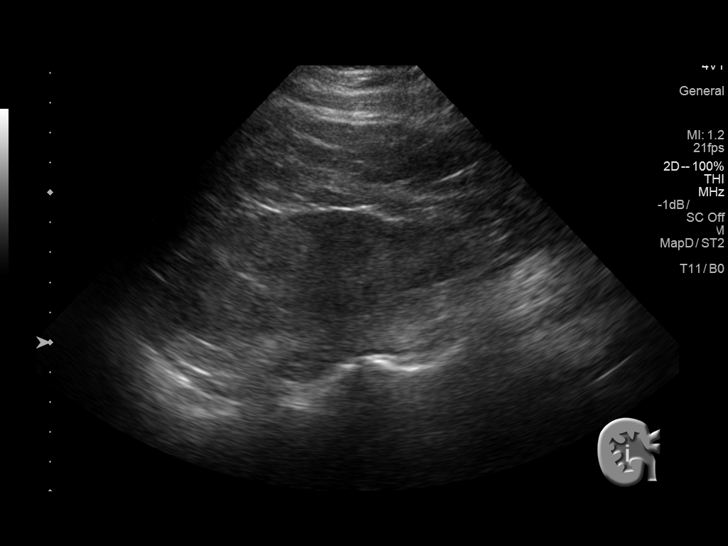
[im 116/139]
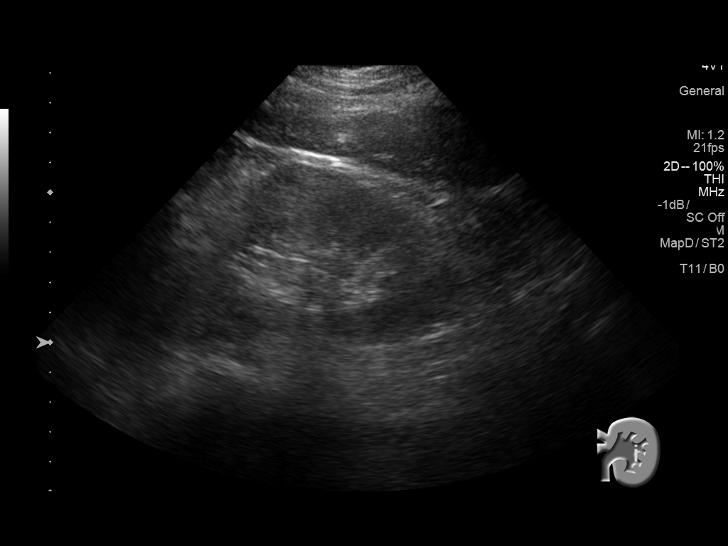
[im 127/139]
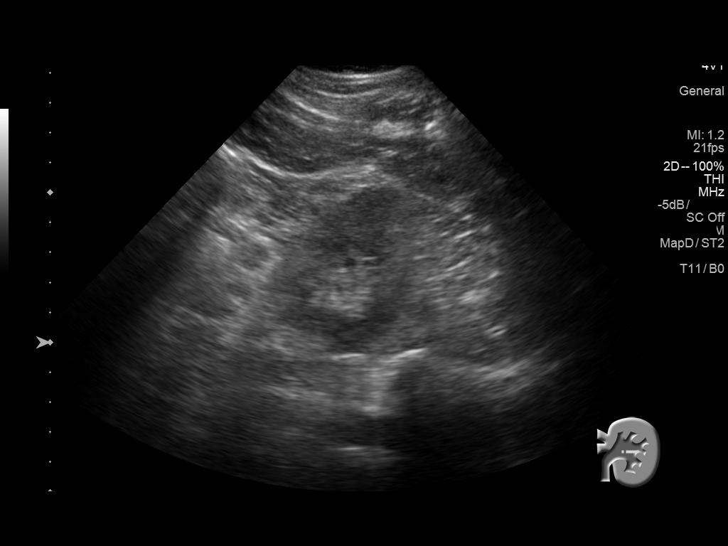
[im 139/139]
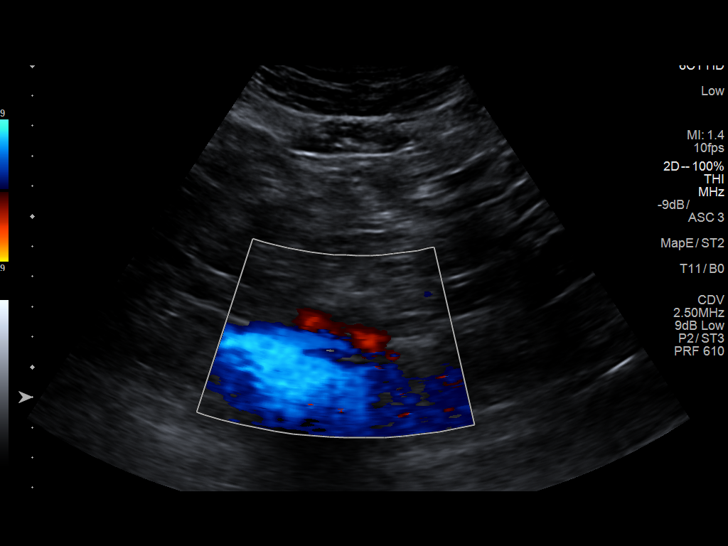

[13 of 25 positions shown; findings below may reference images not displayed]

FINDINGS: Gallbladder: Multiple small layering mobile shadowing stones are
present. A sonographic Murphy sign is present. Wall thickness is
upper limits of normal at 2.8 mm.

Common bile duct: Diameter: 4.5 mm, within normal limits.

Liver: No focal lesion identified. Within normal limits in
parenchymal echogenicity. Portal vein is patent on color Doppler
imaging with normal direction of blood flow towards the liver.

IVC: No abnormality visualized.

Pancreas: Visualized portion unremarkable.

Spleen: Size and appearance within normal limits.

Right Kidney: Length: 11.9 cm, within normal limits. Echogenicity
within normal limits. No mass or hydronephrosis visualized.

Left Kidney: Length: 11.5 cm, within normal limits. Echogenicity
within normal limits. No mass or hydronephrosis visualized.

Abdominal aorta: No aneurysm visualized.

Other findings: None.
IMPRESSION: 1. Layering gallstones with positive sonographic Murphy sign
consistent with cholelithiasis and acute cholecystitis.
2. No dilation of the common bile duct.
3. Abdominal ultrasound is otherwise unremarkable.

## 2020-06-24 ENCOUNTER — Telehealth: Payer: BC Managed Care – PPO | Admitting: Physician Assistant

## 2020-06-24 DIAGNOSIS — R059 Cough, unspecified: Secondary | ICD-10-CM | POA: Diagnosis not present

## 2020-06-24 DIAGNOSIS — U071 COVID-19: Secondary | ICD-10-CM | POA: Diagnosis not present

## 2020-06-24 MED ORDER — BENZONATATE 100 MG PO CAPS: 100.0000 mg | ORAL_CAPSULE | Freq: Three times a day (TID) | ORAL | 0 refills | Status: DC | PRN

## 2020-06-24 MED ORDER — FLUTICASONE PROPIONATE 50 MCG/ACT NA SUSP: 2.0000 | Freq: Every day | NASAL | 6 refills | Status: DC

## 2020-06-24 MED ORDER — NAPROXEN 500 MG PO TABS
500.0000 mg | ORAL_TABLET | Freq: Two times a day (BID) | ORAL | 0 refills | Status: DC
Start: 2020-06-24 — End: 2021-10-18

## 2020-06-24 NOTE — Progress Notes (Signed)
E-Visit for Corona Virus Screening  We are sorry you are not feeling well. We are here to help!  You have tested positive for COVID-19, meaning that you were infected with the novel coronavirus and could give the virus to others.  It is vitally important that you stay home so you do not spread it to others.      Please continue isolation at home, for at least 10 days since the start of your symptoms and until you have had 24 hours with no fever (without taking a fever reducer) and with improving of symptoms.  If you have no symptoms but tested positive (or all symptoms resolve after 5 days and you have no fever) you can leave your house but continue to wear a mask around others for an additional 5 days. If you have a fever,continue to stay home until you have had 24 hours of no fever. Most cases improve 5-10 days from onset but we have seen a small number of patients who have gotten worse after the 10 days.  Please be sure to watch for worsening symptoms and remain taking the proper precautions.   Go to the nearest hospital ED for assessment if fever/cough/breathlessness are severe or illness seems like a threat to life.    The following symptoms may appear 2-14 days after exposure: . Fever . Cough . Shortness of breath or difficulty breathing . Chills . Repeated shaking with chills . Muscle pain . Headache . Sore throat . New loss of taste or smell . Fatigue . Congestion or runny nose . Nausea or vomiting . Diarrhea  You have been enrolled in [facility] Monitoring for COVID-19. Daily you will receive a questionnaire within the MyChart website. Our COVID-19 response team will be monitoring your responses daily.  You can use medication such as A prescription cough medication called Tessalon Perles 100 mg. You may take 1-2 capsules every 8 hours as needed for cough, A prescription anti-inflammatory called Naprosyn 500 mg. Take twice daily as needed for fever or body aches for 2 weeks and  A prescription for Fluticasone nasal spray 2 sprays in each nostril one time per day  You have tested positive for Covid but because you are not considered high risk you do not qualify for monoclonal antibody infusion.  Supportive care is all that is needed.   You may also take acetaminophen (Tylenol) as needed for fever.  HOME CARE: . Only take medications as instructed by your medical team. . Drink plenty of fluids and get plenty of rest. . A steam or ultrasonic humidifier can help if you have congestion.   GET HELP RIGHT AWAY IF YOU HAVE EMERGENCY WARNING SIGNS.  Call 911 or proceed to your closest emergency facility if: . You develop worsening high fever. . Trouble breathing . Bluish lips or face . Persistent pain or pressure in the chest . New confusion . Inability to wake or stay awake . You cough up blood. . Your symptoms become more severe . Inability to hold down food or fluids  This list is not all possible symptoms. Contact your medical provider for any symptoms that are severe or concerning to you.    Your e-visit answers were reviewed by a board certified advanced clinical practitioner to complete your personal care plan.  Depending on the condition, your plan could have included both over the counter or prescription medications.  If there is a problem please reply once you have received a response from your provider.  Your safety is important to [place].  If you have drug allergies check your prescription carefully.    You can use MyChart to ask questions about today's visit, request a non-urgent call back, or ask for a work or school excuse for 24 hours related to this e-Visit. If it has been greater than 24 hours you will need to follow up with your provider, or enter a new e-Visit to address those concerns. You will get an e-mail in the next two days asking about your experience.  I hope that your e-visit has been valuable and will speed your recovery. Thank you for using  e-visits.      Greater than 5 minutes, yet less than 10 minutes of time have been spent researching, coordinating and implementing care for this patient today.

## 2021-02-19 DIAGNOSIS — Z23 Encounter for immunization: Secondary | ICD-10-CM | POA: Diagnosis not present

## 2021-10-18 ENCOUNTER — Ambulatory Visit: Payer: 59 | Admitting: Family Medicine

## 2021-10-18 ENCOUNTER — Encounter: Payer: Self-pay | Admitting: Family Medicine

## 2021-10-18 VITALS — BP 137/93 | HR 77 | Temp 97.7°F | Ht 69.0 in | Wt 165.0 lb

## 2021-10-18 DIAGNOSIS — J0100 Acute maxillary sinusitis, unspecified: Secondary | ICD-10-CM | POA: Diagnosis not present

## 2021-10-18 MED ORDER — AMOXICILLIN-POT CLAVULANATE 875-125 MG PO TABS: 1.0000 | ORAL_TABLET | Freq: Two times a day (BID) | ORAL | 0 refills | Status: AC

## 2021-10-18 MED ORDER — BENZONATATE 100 MG PO CAPS: 200.0000 mg | ORAL_CAPSULE | Freq: Three times a day (TID) | ORAL | 0 refills | Status: DC | PRN

## 2021-10-18 NOTE — Progress Notes (Signed)
? ?Assessment & Plan:  ?1. Acute non-recurrent maxillary sinusitis ?Education provided on sinusitis.  Encouraged to continue OTC medications for symptom management. ?- amoxicillin-clavulanate (AUGMENTIN) 875-125 MG tablet; Take 1 tablet by mouth 2 (two) times daily for 7 days.  Dispense: 14 tablet; Refill: 0 ?- benzonatate (TESSALON PERLES) 100 MG capsule; Take 2 capsules (200 mg total) by mouth 3 (three) times daily as needed for cough.  Dispense: 30 capsule; Refill: 0 ? ? ?Follow up plan: Return if symptoms worsen or fail to improve. ? ?Deliah Boston, MSN, APRN, FNP-C ?[facility] ? ?Subjective:  ? ?Patient ID: Timothy Ross, male    DOB: Jan 05, 1991, 31 y.o.   MRN: [id number] ? ?HPI: ?Timothy Ross is a 31 y.o. male presenting on 10/18/2021 for Cough, Nasal Congestion, and sneezing (X 1 week and cough started 2 days ago ) ? ?Patient complains of cough, head/chest congestion, headache, sneezing, sore throat, facial pain/pressure, and postnasal drainage. Onset of symptoms was 1 week ago, gradually worsening since that time. He is drinking plenty of fluids. Evaluation to date: none. Treatment to date:  Nyquil, Theraflu, and Mucinex . He does not smoke.  ? ? ?ROS: Negative unless specifically indicated above in HPI.  ? ?Relevant past medical history reviewed and updated as indicated.  ? ?Allergies and medications reviewed and updated. ? ?No current outpatient medications on file. ? ?No Known Allergies ? ?Objective:  ? ?BP (!) 137/93   Pulse 77   Temp 97.7 ?F (36.5 ?C) (Temporal)   Ht 5\' 9"  (1.753 m)   Wt 165 lb (74.8 kg)   SpO2 93%   BMI 24.37 kg/m?   ? ?Physical Exam ?Vitals reviewed.  ?Constitutional:   ?   General: He is not in acute distress. ?   Appearance: Normal appearance. He is not ill-appearing, toxic-appearing or diaphoretic.  ?HENT:  ?   Head: Normocephalic and atraumatic.  ?   Right Ear: Ear canal and external ear normal. There is no impacted cerumen. Tympanic membrane is  bulging.  ?   Left Ear: Ear canal and external ear normal. There is no impacted cerumen. Tympanic membrane is bulging.  ?   Nose:  ?   Right Sinus: Maxillary sinus tenderness present. No frontal sinus tenderness.  ?   Left Sinus: Maxillary sinus tenderness present. No frontal sinus tenderness.  ?   Mouth/Throat:  ?   Mouth: Mucous membranes are moist.  ?   Pharynx: Oropharynx is clear. No oropharyngeal exudate or posterior oropharyngeal erythema.  ?   Tonsils: No tonsillar exudate or tonsillar abscesses.  ?Eyes:  ?   General: No scleral icterus.    ?   Right eye: No discharge.     ?   Left eye: No discharge.  ?   Conjunctiva/sclera: Conjunctivae normal.  ?Cardiovascular:  ?   Rate and Rhythm: Normal rate.  ?Pulmonary:  ?   Effort: Pulmonary effort is normal. No respiratory distress.  ?Musculoskeletal:     ?   General: Normal range of motion.  ?   Cervical back: Normal range of motion.  ?Lymphadenopathy:  ?   Cervical: No cervical adenopathy.  ?Skin: ?   General: Skin is warm and dry.  ?Neurological:  ?   Mental Status: He is alert and oriented to person, place, and time. Mental status is at baseline.  ?Psychiatric:     ?   Mood and Affect: Mood normal.     ?   Behavior: Behavior normal.     ?  Thought Content: Thought content normal.     ?   Judgment: Judgment normal.  ? ? ? ? ? ? ?

## 2023-02-15 ENCOUNTER — Ambulatory Visit: Payer: 59

## 2023-02-15 ENCOUNTER — Ambulatory Visit: Payer: 59 | Admitting: Family Medicine

## 2023-02-15 VITALS — BP 116/85 | HR 68 | Temp 98.1°F | Resp 20 | Ht 69.0 in | Wt 164.0 lb

## 2023-02-15 DIAGNOSIS — L237 Allergic contact dermatitis due to plants, except food: Secondary | ICD-10-CM | POA: Diagnosis not present

## 2023-02-15 MED ORDER — CETIRIZINE HCL 10 MG PO TABS: 10.0000 mg | ORAL_TABLET | Freq: Every evening | ORAL | 0 refills | Status: AC | PRN

## 2023-02-15 MED ORDER — PREDNISONE 10 MG (21) PO TBPK: ORAL_TABLET | ORAL | 0 refills | Status: AC

## 2023-02-15 MED ORDER — METHYLPREDNISOLONE ACETATE 40 MG/ML IJ SUSP
40.0000 mg | Freq: Once | INTRAMUSCULAR | Status: AC
  Administered 2023-02-15: 40 mg via INTRAMUSCULAR

## 2023-02-15 MED ORDER — HYDROXYZINE PAMOATE 25 MG PO CAPS: 25.0000 mg | ORAL_CAPSULE | Freq: Three times a day (TID) | ORAL | 0 refills | Status: AC | PRN

## 2023-02-15 NOTE — Patient Instructions (Signed)
Poison Ivy Dermatitis Poison ivy dermatitis is redness and soreness of the skin caused by chemicals in the leaves of the poison ivy plant. You may have very bad itching, swelling, a rash, and blisters. What are the causes? Touching a poison ivy plant. Touching something that has the chemical on it. This may include animals or objects that have come in contact with the plant. What increases the risk? Going outdoors often in wooded or marshy areas. Going outdoors without wearing protective clothing, such as closed shoes, long pants, and a long-sleeved shirt. What are the signs or symptoms?  Skin redness. Very bad itching. A rash that often includes bumps and blisters. The rash usually appears 48 hours after exposure, if you have had it before. If this is the first time you have it, the rash may not appear until a week after exposure. Swelling. This may occur if the reaction is very bad. Symptoms usually last for 1-2 weeks. The first time you get this condition, symptoms may last 3-4 weeks. How is this treated? This condition may be treated with: Hydrocortisone cream or calamine lotion to relieve itching. Oatmeal baths to soothe the skin. Medicines, such as over-the-counter antihistamine tablets. Oral steroid medicine for very bad reactions. Follow these instructions at home: Medicines Take or apply over-the-counter and prescription medicines only as told by your doctor. Use hydrocortisone cream or calamine lotion as needed to help with itching. General instructions Do not scratch or rub your skin. Put a cold, wet cloth (cold compress) on the affected areas or take baths in cool water. This will help with itching. Avoid hot baths and showers. Take oatmeal baths as needed. Use colloidal oatmeal. You can get this at a pharmacy or grocery store. Follow the instructions on the package. While you have the rash, wash your clothes right after you wear them. Check the affected area every day  for signs of infection. Check for: More redness, swelling, or pain. Fluid or blood. Warmth. Pus or a bad smell. Keep all follow-up visits. Your doctor may want to see how your skin is doing with treatment. How is this prevented?  Know what poison ivy looks like, so you can avoid it. This plant has three leaves with flowering branches on a single stem. The leaves are glossy. The leaves have uneven edges that come to a point. If you touch poison ivy, wash your skin with soap and water right away. Be sure to wash under your fingernails. When hiking or camping, wear long pants, a long-sleeved shirt, long socks, and hiking boots. You can also use a lotion on your skin that helps to prevent contact with poison ivy. If you think that your clothes or outdoor gear came in contact with poison ivy, rinse them off with a garden hose before you bring them inside your house. When doing yard work or gardening, wear gloves, long sleeves, long pants, and boots. Wash your garden tools and gloves if they come in contact with poison ivy. If you think that your pet has come into contact with poison ivy, wash them with pet shampoo and water. Make sure to wear gloves while washing your pet. Contact a doctor if: You have open sores in the rash area. You have any signs of infection. You have redness that spreads past the rash area. You have a fever. You have a rash over a large area of your body. You have a rash on your eyes, mouth, or genitals. Your rash does not get better after   a few weeks. Get help right away if: Your face swells or your eyes swell shut. You have trouble breathing. You have trouble swallowing. These symptoms may be an emergency. Do not wait to see if the symptoms will go away. Get help right away. Call 911. This information is not intended to replace advice given to you by your health care provider. Make sure you discuss any questions you have with your health care provider. Document  Revised: 11/02/2021 Document Reviewed: 11/02/2021 Elsevier Patient Education  2024 [employer]  

## 2023-02-15 NOTE — Progress Notes (Signed)
   Subjective: Timothy Ross PCP: Timothy Spencer, FNP EAV:[id number] [name] is a 32 y.o. male presenting to clinic today for:  1. Rash Reports a 4 day history of itchy rash that started on his groin after working outside. Has rash now on low back on bilateral anterior thighs.  Over-the-counter products not helpful and he has had this previously got really bad.   ROS: Per HPI  No Known Allergies Past Medical History:  Diagnosis Date   GERD (gastroesophageal reflux disease)    History of cardiovascular stress test 2014   normal stress echo    Current Outpatient Medications:    cetirizine (ZYRTEC) 10 MG tablet, Take 1 tablet (10 mg total) by mouth at bedtime as needed (itching/ rash)., Disp: 14 tablet, Rfl: 0   hydrOXYzine (VISTARIL) 25 MG capsule, Take 1 capsule (25 mg total) by mouth every 8 (eight) hours as needed for itching (**causes sleepiness)., Disp: 30 capsule, Rfl: 0   predniSONE (STERAPRED UNI-PAK 21 TAB) 10 MG (21) TBPK tablet, Take 60mg  by mouth day 1, 50mg  day 2, 40mg  day 3, 30mg  day 4, 20mg  day 5, 10mg  day 6.  Then stop., Disp: 21 tablet, Rfl: 0  Current Facility-Administered Medications:    methylPREDNISolone acetate (DEPO-MEDROL) injection 40 mg, 40 mg, Intramuscular, Once,  Social History   Socioeconomic History   Marital status: Married    Spouse name: Not on file   Number of children: Not on file   Years of education: Not on file   Highest education level: Not on file  Occupational History   Not on file  Tobacco Use   Smoking status: Never   Smokeless tobacco: Never  Vaping Use   Vaping status: Never Used  Substance and Sexual Activity   Alcohol use: Yes    Comment: occasionally   Drug use: No   Sexual activity: Yes  Other Topics Concern   Not on file  Social History Narrative   Not on file   Social Determinants of Health   Financial Resource Strain: Not on file  Food Insecurity: Not on file  Transportation Needs: Not on file  Physical Activity: Not  on file  Stress: Not on file  Social Connections: Not on file  Intimate Partner Violence: Not on file   Family History  Problem Relation Age of Onset   Hypertension Mother    Diabetes Maternal Grandmother    Colon cancer Neg Hx    Gastric cancer Neg Hx    Esophageal cancer Neg Hx     Objective: Office vital signs reviewed. BP 116/85   Pulse 68   Temp 98.1 F (36.7 C) (Temporal)   Resp 20   Ht 5\' 9"  (1.753 m)   Wt 164 lb (74.4 kg)   SpO2 95%   BMI 24.22 kg/m   Physical Examination:  General: Awake, alert, well nourished, No acute distress Skin: Raised, patchy, minimally erythematous rash noted along the upper sacral and low back area.  He has a few lesions along the medial thighs bilaterally.  Assessment/ Plan: 32 y.o. male   Poison ivy dermatitis - Plan: methylPREDNISolone acetate (DEPO-MEDROL) injection 40 mg, predniSONE (STERAPRED UNI-PAK 21 TAB) 10 MG (21) TBPK tablet, cetirizine (ZYRTEC) 10 MG tablet, hydrOXYzine (VISTARIL) 25 MG capsule  Corticosteroid injection administered.  Prednisone to be started with breakfast tomorrow.  Cetirizine at nighttime and hydroxyzine as needed breakthrough itching.  Handout provided.  Follow-up as needed   Raliegh Ip, DO [facility] [phone]
